# Patient Record
Sex: Female | Born: 1960 | Race: White | Hispanic: No | Marital: Married | Smoking: Former smoker
Health system: Southern US, Community
[De-identification: ages and names within clinical notes are randomized; demographics above are authoritative.]

## PROBLEM LIST (undated history)

## (undated) DIAGNOSIS — F32A Depression, unspecified: Secondary | ICD-10-CM

## (undated) DIAGNOSIS — G500 Trigeminal neuralgia: Secondary | ICD-10-CM

## (undated) DIAGNOSIS — E669 Obesity, unspecified: Secondary | ICD-10-CM

## (undated) DIAGNOSIS — Z780 Asymptomatic menopausal state: Secondary | ICD-10-CM

## (undated) DIAGNOSIS — F329 Major depressive disorder, single episode, unspecified: Secondary | ICD-10-CM

## (undated) DIAGNOSIS — I10 Essential (primary) hypertension: Secondary | ICD-10-CM

## (undated) DIAGNOSIS — M722 Plantar fascial fibromatosis: Secondary | ICD-10-CM

## (undated) DIAGNOSIS — E079 Disorder of thyroid, unspecified: Secondary | ICD-10-CM

## (undated) HISTORY — PX: CHOLECYSTECTOMY: SHX55

## (undated) HISTORY — DX: Trigeminal neuralgia: G50.0

## (undated) HISTORY — DX: Plantar fascial fibromatosis: M72.2

## (undated) HISTORY — DX: Asymptomatic menopausal state: Z78.0

## (undated) HISTORY — DX: Major depressive disorder, single episode, unspecified: F32.9

## (undated) HISTORY — DX: Depression, unspecified: F32.A

## (undated) HISTORY — PX: ABDOMINAL HYSTERECTOMY: SHX81

## (undated) HISTORY — DX: Obesity, unspecified: E66.9

## (undated) HISTORY — PX: FACIAL SIALOCELE MARSUPILIZATION: SHX1575

---

## 2005-02-11 ENCOUNTER — Ambulatory Visit: Payer: Self-pay | Admitting: Family Medicine

## 2006-02-17 ENCOUNTER — Ambulatory Visit: Payer: Self-pay | Admitting: Family Medicine

## 2007-06-01 DIAGNOSIS — J309 Allergic rhinitis, unspecified: Secondary | ICD-10-CM | POA: Insufficient documentation

## 2007-06-01 DIAGNOSIS — E039 Hypothyroidism, unspecified: Secondary | ICD-10-CM | POA: Insufficient documentation

## 2007-06-01 DIAGNOSIS — G500 Trigeminal neuralgia: Secondary | ICD-10-CM | POA: Insufficient documentation

## 2007-06-28 ENCOUNTER — Ambulatory Visit: Payer: Self-pay | Admitting: Family Medicine

## 2007-11-26 DIAGNOSIS — M722 Plantar fascial fibromatosis: Secondary | ICD-10-CM | POA: Insufficient documentation

## 2008-12-07 ENCOUNTER — Ambulatory Visit: Payer: Self-pay | Admitting: Unknown Physician Specialty

## 2009-02-08 ENCOUNTER — Ambulatory Visit: Payer: Self-pay | Admitting: Unknown Physician Specialty

## 2009-02-14 ENCOUNTER — Ambulatory Visit: Payer: Self-pay | Admitting: Unknown Physician Specialty

## 2010-08-02 ENCOUNTER — Ambulatory Visit: Payer: Self-pay | Admitting: Family Medicine

## 2011-04-28 ENCOUNTER — Ambulatory Visit: Payer: Self-pay | Admitting: Internal Medicine

## 2012-06-29 DIAGNOSIS — I10 Essential (primary) hypertension: Secondary | ICD-10-CM | POA: Insufficient documentation

## 2012-08-18 LAB — HM PAP SMEAR: HM Pap smear: NEGATIVE

## 2012-09-21 ENCOUNTER — Ambulatory Visit: Payer: Self-pay | Admitting: Family Medicine

## 2012-09-22 ENCOUNTER — Ambulatory Visit: Payer: Self-pay | Admitting: Family Medicine

## 2013-11-01 ENCOUNTER — Ambulatory Visit: Payer: Self-pay | Admitting: Family Medicine

## 2014-09-04 DIAGNOSIS — F32A Depression, unspecified: Secondary | ICD-10-CM | POA: Insufficient documentation

## 2014-09-04 DIAGNOSIS — F330 Major depressive disorder, recurrent, mild: Secondary | ICD-10-CM | POA: Insufficient documentation

## 2014-12-27 ENCOUNTER — Encounter: Payer: Self-pay | Admitting: Emergency Medicine

## 2014-12-27 ENCOUNTER — Ambulatory Visit
Admission: EM | Admit: 2014-12-27 | Discharge: 2014-12-27 | Disposition: A | Discharge status: Home / Self Care | Payer: PRIVATE HEALTH INSURANCE | Attending: Family Medicine | Admitting: Family Medicine

## 2014-12-27 DIAGNOSIS — J029 Acute pharyngitis, unspecified: Secondary | ICD-10-CM | POA: Diagnosis not present

## 2014-12-27 DIAGNOSIS — J0100 Acute maxillary sinusitis, unspecified: Secondary | ICD-10-CM

## 2014-12-27 HISTORY — DX: Disorder of thyroid, unspecified: E07.9

## 2014-12-27 LAB — RAPID STREP SCREEN (MED CTR MEBANE ONLY): Streptococcus, Group A Screen (Direct): NEGATIVE

## 2014-12-27 MED ORDER — AZITHROMYCIN 250 MG PO TABS: ORAL_TABLET | ORAL | Status: DC

## 2014-12-27 MED ORDER — BENZONATATE 100 MG PO CAPS: 100.0000 mg | ORAL_CAPSULE | Freq: Three times a day (TID) | ORAL | Status: DC | PRN

## 2014-12-27 NOTE — ED Provider Notes (Signed)
[facility] Department Provider Note  ____________________________________________  Time seen: Approximately 11:19 AM  I have reviewed the triage vital signs and the nursing notes.   HISTORY  Chief Complaint Nasal Congestion   HPI Christine Moody is a 54 y.o. female presents with complaints of 2-3 days history of runny nose, congestion and sore throat. Patient states started with sore throat, but reports some congestion prior to that.Also reports sinus pressure. States rare cough. States cold chills yesterday at home but denies known fever. Reports continues to eat and drink well. Denies chest pain, shortness of breath, abdominal pain, nausea, vomiting or diarrhea. States sore throat pain is scratchy and hurts to swallow at 6 out of 10. Denies other pain. Denies pain radiation.   Past Medical History  Diagnosis Date  . Thyroid disease     There are no active problems to display for this patient.   Past Surgical History  Procedure Laterality Date  . Abdominal hysterectomy    . Cholecystectomy      Current Outpatient Rx  Name  Route  Sig  Dispense  Refill  . aspirin EC 81 MG tablet   Oral   Take 81 mg by mouth daily.         . carbamazepine (TEGRETOL) 200 MG tablet   Oral   Take 200 mg by mouth 3 (three) times daily.         . citalopram (CELEXA) 20 MG tablet   Oral   Take 20 mg by mouth daily.         Marland Kitchen levothyroxine (SYNTHROID, LEVOTHROID) 125 MCG tablet   Oral   Take 125 mcg by mouth daily before breakfast.           Allergies Penicillins  History reviewed. No pertinent family history.  Social History Social History  Substance Use Topics  . Smoking status: Never Smoker   . Smokeless tobacco: None  . Alcohol Use: No    Review of Systems Constitutional: No fever/chills Eyes: No visual changes. ENT: Positive for runny nose, congestion, sore throat. Cardiovascular: Denies chest pain. Respiratory: Denies  shortness of breath. Gastrointestinal: No abdominal pain.  No nausea, no vomiting.  No diarrhea.  No constipation. Genitourinary: Negative for dysuria. Musculoskeletal: Negative for back pain. Skin: Negative for rash. Neurological: Negative for headaches, focal weakness or numbness.  10-point ROS otherwise negative.  ____________________________________________   PHYSICAL EXAM:  VITAL SIGNS: ED Triage Vitals  Enc Vitals Group     BP 12/27/14 1044 149/82 mmHg     Pulse Rate 12/27/14 1044 66     Resp 12/27/14 1044 18     Temp 12/27/14 1044 97.8 F (36.6 C)     Temp Source 12/27/14 1044 Tympanic     SpO2 12/27/14 1044 99 %     Weight 12/27/14 1044 316 lb (143.337 kg)     Height 12/27/14 1044 5\' 9"  (1.753 m)     Head Cir --      Peak Flow --      Pain Score 12/27/14 1048 6     Pain Loc --      Pain Edu? --      Excl. in [place]? --     Constitutional: Alert and oriented. Well appearing and in no acute distress. Eyes: Conjunctivae are normal. PERRL. EOMI. Head: Atraumatic.Mild TTP maxillary sinus, frontal sinus nonTTP.  Ears: no erythema, normal TMs bilaterally.   Nose: mild clear rhinorrhea.  Mouth/Throat: Mucous membranes are moist.  Pharynx  mod erythema, tonsils surgically absent. No exudate. Clearing secretions well.  Neck: No stridor.  No cervical spine tenderness to palpation. Hematological/Lymphatic/Immunilogical: mild anterior cervical lymphadenopathy. Cardiovascular: Normal rate, regular rhythm. Grossly normal heart sounds.  Good peripheral circulation. Respiratory: Normal respiratory effort.  No retractions. Lungs CTAB. Gastrointestinal: Soft and nontender. No distention. Normal Bowel sounds.  No abdominal bruits. No CVA tenderness. Musculoskeletal: No lower or upper extremity tenderness nor edema.  No joint effusions. Bilateral pedal pulses equal and easily palpated.  Neurologic:  Normal speech and language. No gross focal neurologic deficits are appreciated. No gait  instability. Skin:  Skin is warm, dry and intact. No rash noted. Psychiatric: Mood and affect are normal. Speech and behavior are normal.   ____________________________________________   LABS (all labs ordered are listed, but only abnormal results are displayed)  Labs Reviewed  RAPID STREP SCREEN (NOT AT [facility])  CULTURE, GROUP A STREP ([facility] ONLY)   _________________________________________   INITIAL IMPRESSION / ASSESSMENT AND PLAN / ED COURSE  Pertinent labs & imaging results that were available during my care of the patient were reviewed by me and considered in my medical decision making (see chart for details).  Well appearing patient. No acute distress. Presents for sore throat with some nasal congestion since monday. Denies chest pain, shortness of breath, abdominal pain. Will swab for strep throat. Strep swab negative, will culture. Reports sore throat x 2-3 days but states congestion and sinus pressure present prior to last 2-3 days. Will treat pharyngitis and sinusitis with azithromycin, and discussed supportive treatments including fluids and rest. Discussed follow up and return parameters. Patient verbalized understanding and agreed to plan.  ____________________________________________   FINAL CLINICAL IMPRESSION(S) / ED DIAGNOSES  Final diagnoses:  Pharyngitis  Acute maxillary sinusitis, recurrence not specified       Marylene Land, NP 12/27/14 1249

## 2014-12-27 NOTE — ED Notes (Signed)
Pt with a cough and nasal congestion x 3 days

## 2014-12-27 NOTE — Discharge Instructions (Signed)
Take medication as prescribed. Rest. Drink plenty of fluids.   Follow up with your primary care physician as needed. Return to Urgent care for new or worsening concerns.   Pharyngitis Pharyngitis is redness, pain, and swelling (inflammation) of your pharynx.  CAUSES  Pharyngitis is usually caused by infection. Most of the time, these infections are from viruses (viral) and are part of a cold. However, sometimes pharyngitis is caused by bacteria (bacterial). Pharyngitis can also be caused by allergies. Viral pharyngitis may be spread from person to person by coughing, sneezing, and personal items or utensils (cups, forks, spoons, toothbrushes). Bacterial pharyngitis may be spread from person to person by more intimate contact, such as kissing.  SIGNS AND SYMPTOMS  Symptoms of pharyngitis include:   Sore throat.   Tiredness (fatigue).   Low-grade fever.   Headache.  Joint pain and muscle aches.  Skin rashes.  Swollen lymph nodes.  Plaque-like film on throat or tonsils (often seen with bacterial pharyngitis). DIAGNOSIS  Your health care provider will ask you questions about your illness and your symptoms. Your medical history, along with a physical exam, is often all that is needed to diagnose pharyngitis. Sometimes, a rapid strep test is done. Other lab tests may also be done, depending on the suspected cause.  TREATMENT  Viral pharyngitis will usually get better in 3-4 days without the use of medicine. Bacterial pharyngitis is treated with medicines that kill germs (antibiotics).  HOME CARE INSTRUCTIONS   Drink enough water and fluids to keep your urine clear or pale yellow.   Only take over-the-counter or prescription medicines as directed by your health care provider:   If you are prescribed antibiotics, make sure you finish them even if you start to feel better.   Do not take aspirin.   Get lots of rest.   Gargle with 8 oz of salt water ( tsp of salt per 1 qt of  water) as often as every 1-2 hours to soothe your throat.   Throat lozenges (if you are not at risk for choking) or sprays may be used to soothe your throat. SEEK MEDICAL CARE IF:   You have large, tender lumps in your neck.  You have a rash.  You cough up green, yellow-brown, or bloody spit. SEEK IMMEDIATE MEDICAL CARE IF:   Your neck becomes stiff.  You drool or are unable to swallow liquids.  You vomit or are unable to keep medicines or liquids down.  You have severe pain that does not go away with the use of recommended medicines.  You have trouble breathing (not caused by a stuffy nose). MAKE SURE YOU:   Understand these instructions.  Will watch your condition.  Will get help right away if you are not doing well or get worse. Document Released: 04/07/2005 Document Revised: 01/26/2013 Document Reviewed: 12/13/2012 [facility] Patient Information 2015 [place], [place]. This information is not intended to replace advice given to you by your health care provider. Make sure you discuss any questions you have with your health care provider.  Sinusitis Sinusitis is redness, soreness, and inflammation of the paranasal sinuses. Paranasal sinuses are air pockets within the bones of your face (beneath the eyes, the middle of the forehead, or above the eyes). In healthy paranasal sinuses, mucus is able to drain out, and air is able to circulate through them by way of your nose. However, when your paranasal sinuses are inflamed, mucus and air can become trapped. This can allow bacteria and other germs to  grow and cause infection. Sinusitis can develop quickly and last only a short time (acute) or continue over a long period (chronic). Sinusitis that lasts for more than 12 weeks is considered chronic.  CAUSES  Causes of sinusitis include:  Allergies.  Structural abnormalities, such as displacement of the cartilage that separates your nostrils (deviated septum), which can decrease the air  flow through your nose and sinuses and affect sinus drainage.  Functional abnormalities, such as when the small hairs (cilia) that line your sinuses and help remove mucus do not work properly or are not present. SIGNS AND SYMPTOMS  Symptoms of acute and chronic sinusitis are the same. The primary symptoms are pain and pressure around the affected sinuses. Other symptoms include:  Upper toothache.  Earache.  Headache.  Bad breath.  Decreased sense of smell and taste.  A cough, which worsens when you are lying flat.  Fatigue.  Fever.  Thick drainage from your nose, which often is green and may contain pus (purulent).  Swelling and warmth over the affected sinuses. DIAGNOSIS  Your health care provider will perform a physical exam. During the exam, your health care provider may:  Look in your nose for signs of abnormal growths in your nostrils (nasal polyps).  Tap over the affected sinus to check for signs of infection.  View the inside of your sinuses (endoscopy) using an imaging device that has a light attached (endoscope). If your health care provider suspects that you have chronic sinusitis, one or more of the following tests may be recommended:  Allergy tests.  Nasal culture. A sample of mucus is taken from your nose, sent to a lab, and screened for bacteria.  Nasal cytology. A sample of mucus is taken from your nose and examined by your health care provider to determine if your sinusitis is related to an allergy. TREATMENT  Most cases of acute sinusitis are related to a viral infection and will resolve on their own within 10 days. Sometimes medicines are prescribed to help relieve symptoms (pain medicine, decongestants, nasal steroid sprays, or saline sprays).  However, for sinusitis related to a bacterial infection, your health care provider will prescribe antibiotic medicines. These are medicines that will help kill the bacteria causing the infection.  Rarely, sinusitis  is caused by a fungal infection. In theses cases, your health care provider will prescribe antifungal medicine. For some cases of chronic sinusitis, surgery is needed. Generally, these are cases in which sinusitis recurs more than 3 times per year, despite other treatments. HOME CARE INSTRUCTIONS   Drink plenty of water. Water helps thin the mucus so your sinuses can drain more easily.  Use a humidifier.  Inhale steam 3 to 4 times a day (for example, sit in the bathroom with the shower running).  Apply a warm, moist washcloth to your face 3 to 4 times a day, or as directed by your health care provider.  Use saline nasal sprays to help moisten and clean your sinuses.  Take medicines only as directed by your health care provider.  If you were prescribed either an antibiotic or antifungal medicine, finish it all even if you start to feel better. SEEK IMMEDIATE MEDICAL CARE IF:  You have increasing pain or severe headaches.  You have nausea, vomiting, or drowsiness.  You have swelling around your face.  You have vision problems.  You have a stiff neck.  You have difficulty breathing. MAKE SURE YOU:   Understand these instructions.  Will watch your condition.  Will get help right away if you are not doing well or get worse. Document Released: 04/07/2005 Document Revised: 08/22/2013 Document Reviewed: 04/22/2011 [facility] Patient Information 2015 [place], [place]. This information is not intended to replace advice given to you by your health care provider. Make sure you discuss any questions you have with your health care provider.

## 2014-12-29 LAB — CULTURE, GROUP A STREP (THRC)

## 2015-04-10 ENCOUNTER — Ambulatory Visit
Admission: EM | Admit: 2015-04-10 | Discharge: 2015-04-10 | Disposition: A | Discharge status: Home / Self Care | Payer: PRIVATE HEALTH INSURANCE | Attending: Family Medicine | Admitting: Family Medicine

## 2015-04-10 ENCOUNTER — Encounter: Payer: Self-pay | Admitting: Emergency Medicine

## 2015-04-10 DIAGNOSIS — N764 Abscess of vulva: Secondary | ICD-10-CM | POA: Diagnosis not present

## 2015-04-10 HISTORY — DX: Essential (primary) hypertension: I10

## 2015-04-10 MED ORDER — HYDROCODONE-ACETAMINOPHEN 5-325 MG PO TABS: 1.0000 | ORAL_TABLET | Freq: Three times a day (TID) | ORAL | Status: DC | PRN

## 2015-04-10 MED ORDER — MUPIROCIN 2 % EX OINT: 1.0000 "application " | TOPICAL_OINTMENT | Freq: Three times a day (TID) | CUTANEOUS | Status: DC

## 2015-04-10 MED ORDER — SULFAMETHOXAZOLE-TRIMETHOPRIM 800-160 MG PO TABS: 1.0000 | ORAL_TABLET | Freq: Two times a day (BID) | ORAL | Status: DC

## 2015-04-10 MED ORDER — KETOCONAZOLE 2 % EX CREA: 1.0000 "application " | TOPICAL_CREAM | Freq: Two times a day (BID) | CUTANEOUS | Status: DC

## 2015-04-10 MED ORDER — KETOCONAZOLE 2 % EX CREA: 1.0000 "application " | TOPICAL_CREAM | Freq: Every day | CUTANEOUS | Status: DC

## 2015-04-10 MED ORDER — CEFTRIAXONE SODIUM 1 G IJ SOLR
1.0000 g | Freq: Once | INTRAMUSCULAR | Status: AC
  Administered 2015-04-10: 1 g via INTRAMUSCULAR

## 2015-04-10 NOTE — ED Notes (Signed)
Patient c/o tender lump on her labia since the past weekend.

## 2015-04-10 NOTE — ED Provider Notes (Addendum)
CSN: [id number]     Arrival date & time 04/10/15  1341 History   First MD Initiated Contact with Patient 04/10/15 1515    Nurses notes were reviewed. Chief Complaint  Patient presents with  . Abscess   Patient is here because of her right labia abscess/swelling she states that on Friday she startedwith swelling in the right labia area. Never had labia swollen for the stasis is getting worse being bigger. Due to her husband's disability she has not been sexually active for over year. She denies any fever but states last night swelling in the right labia got worse and became more painful. She also reports increased moisture and irritation itching and irritation in that area as well. And was asking for what she can do to keep itching and irritation down in that area.     (Consider location/radiation/quality/duration/timing/severity/associated sxs/prior Treatment) Patient is a 54 y.o. female presenting with abscess. The history is provided by the patient. No language interpreter was used.  Abscess Location:  Ano-genital Ano-genital abscess location:  Vagina (Right labia) Abscess quality: fluctuance, induration, painful, redness and warmth   Red streaking: no   Pain details:    Quality:  Pressure, throbbing, shooting and sharp   Severity:  Severe Context: not diabetes, not immunosuppression, not injected drug use and not insect bite/sting   Relieved by:  Nothing Worsened by:  Nothing tried   Past Medical History  Diagnosis Date  . Thyroid disease   . Hypertension    Past Surgical History  Procedure Laterality Date  . Abdominal hysterectomy    . Cholecystectomy     History reviewed. No pertinent family history. Social History  Substance Use Topics  . Smoking status: Never Smoker   . Smokeless tobacco: None  . Alcohol Use: No   OB History    No data available     Review of Systems  Genitourinary: Positive for vaginal pain.  Skin: Positive for rash.    Allergies    Penicillins  Home Medications   Prior to Admission medications   Medication Sig Start Date End Date Taking? Authorizing Provider  lisinopril (PRINIVIL,ZESTRIL) 10 MG tablet Take 10 mg by mouth daily.   Yes Historical Provider, MD  aspirin EC 81 MG tablet Take 81 mg by mouth daily.    Historical Provider, MD  azithromycin (ZITHROMAX Z-PAK) 250 MG tablet Take 2 tablets (500 mg) on  Day 1,  followed by 1 tablet (250 mg) once daily on Days 2 through 5. 12/27/14   Marylene Land, NP  benzonatate (TESSALON PERLES) 100 MG capsule Take 1 capsule (100 mg total) by mouth 3 (three) times daily as needed for cough. 12/27/14   Marylene Land, NP  carbamazepine (TEGRETOL) 200 MG tablet Take 200 mg by mouth 3 (three) times daily.    Historical Provider, MD  citalopram (CELEXA) 20 MG tablet Take 20 mg by mouth daily.    Historical Provider, MD  ketoconazole (NIZORAL) 2 % cream Apply 1 application topically daily. 04/10/15   Frederich Cha, MD  levothyroxine (SYNTHROID, LEVOTHROID) 125 MCG tablet Take 125 mcg by mouth daily before breakfast.    Historical Provider, MD  mupirocin ointment (BACTROBAN) 2 % Apply 1 application topically 3 (three) times daily. 04/10/15   Frederich Cha, MD  sulfamethoxazole-trimethoprim (BACTRIM DS,SEPTRA DS) 800-160 MG tablet Take 1 tablet by mouth 2 (two) times daily. 04/10/15   Frederich Cha, MD   Meds Ordered and Administered this Visit   Medications  cefTRIAXone (ROCEPHIN) injection 1 g (  not administered)    BP 132/80 mmHg  Pulse 75  Temp(Src) 97.7 F (36.5 C) (Tympanic)  Resp 17  Ht 5\' 8"  (1.727 m)  Wt 300 lb (136.079 kg)  BMI 45.63 kg/m2  SpO2 99% No data found.   Physical Exam  Constitutional: She is oriented to person, place, and time. She appears well-developed and well-nourished.  HENT:  Head: Normocephalic.  Neck: Neck supple.  Genitourinary:     Patient had marked swelling of the right labia about a third of the way down to about almost rest of the outer  labia lip.  Neurological: She is alert and oriented to person, place, and time. No cranial nerve deficit.  Skin: Skin is warm.  Psychiatric: She has a normal mood and affect.    ED Course  .Marland KitchenIncision and Drainage Date/Time: 04/10/2015 4:58 PM Performed by: Frederich Cha Authorized by: Frederich Cha Consent: Verbal consent obtained. Risks and benefits: risks, benefits and alternatives were discussed Consent given by: patient Type: abscess Body area: anogenital Location details: vulva Anesthesia method: Labia was injected with 2% lidocaine. Local anesthetic: lidocaine 2% without epinephrine Patient sedated: no Scalpel size: 11 Incision type: single straight Complexity: complex Drainage: bloody Drainage amount: copious Wound treatment: wound left open Packing material: 1/4 in gauze Comments: Initially an incision was made inferior aspect of the abscess unsuccessful though. Then another incision was made superior to that which did get a lot of bloody thick material out. The amount of thickness of the material reminds more of a sebaceous cyst that got infected versus a true abscess started on his own. Patient reports a lot of discomfort during the procedure but then improvement at the procedure was done. Initially plan to put a suture in the lower incision but after the packing and the swelling of the initial labia abscess went down the wound closed on its own.   (including critical care time)  Labs Review Labs Reviewed  CULTURE, ROUTINE-ABSCESS    Imaging Review No results found.   Visual Acuity Review  Right Eye Distance:   Left Eye Distance:   Bilateral Distance:    Right Eye Near:   Left Eye Near:    Bilateral Near:         MDM   1. Labial abscess     We'll place on Septra DS 1 tablet twice a day. Vicodin for pain work note for today and tomorrow and Thursday and return Thursday for removal of packing and possible repacking. Will also add Bactroban ointment to  use 3 times a day once the packing has been removed. Also discussed by hygiene in the area and using Nizoral cream twice a day for about 2 weeks keeping the area extremely dry by changing unawares and wearing cotton underwear's and sleeping without pain on it night. She states she uses pads and what medication past more frequently as well she is having trouble with moisture being kept the area. Should be noted culture was also obtained on the wound.  Due to the nature and size of abscess she is also given a gram of Rocephin IM as well. Patient tolerated the injection well.    Frederich Cha, MD 04/10/15 1703  Frederich Cha, MD 04/17/15 [phone]

## 2015-04-10 NOTE — Discharge Instructions (Signed)
Abscess °An abscess (boil or furuncle) is an infected area on or under the skin. This area is filled with yellowish-white fluid (pus) and other material (debris). °HOME CARE  °· Only take medicines as told by your doctor. °· If you were given antibiotic medicine, take it as directed. Finish the medicine even if you start to feel better. °· If gauze is used, follow your doctor's directions for changing the gauze. °· To avoid spreading the infection: °¨ Keep your abscess covered with a bandage. °¨ Wash your hands well. °¨ Do not share personal care items, towels, or whirlpools with others. °¨ Avoid skin contact with others. °· Keep your skin and clothes clean around the abscess. °· Keep all doctor visits as told. °GET HELP RIGHT AWAY IF:  °· You have more pain, puffiness (swelling), or redness in the wound site. °· You have more fluid or blood coming from the wound site. °· You have muscle aches, chills, or you feel sick. °· You have a fever. °MAKE SURE YOU:  °· Understand these instructions. °· Will watch your condition. °· Will get help right away if you are not doing well or get worse. °  °This information is not intended to replace advice given to you by your health care provider. Make sure you discuss any questions you have with your health care provider. °  °Document Released: 09/24/2007 Document Revised: 10/07/2011 Document Reviewed: 06/21/2011 °[employer] Interactive Patient Education ©2016 [employer] ° °

## 2015-04-12 ENCOUNTER — Encounter: Payer: Self-pay | Admitting: Emergency Medicine

## 2015-04-12 ENCOUNTER — Ambulatory Visit
Admission: EM | Admit: 2015-04-12 | Discharge: 2015-04-12 | Disposition: A | Discharge status: Home / Self Care | Payer: PRIVATE HEALTH INSURANCE | Attending: Family Medicine | Admitting: Family Medicine

## 2015-04-12 DIAGNOSIS — Z4801 Encounter for change or removal of surgical wound dressing: Secondary | ICD-10-CM

## 2015-04-12 DIAGNOSIS — Z09 Encounter for follow-up examination after completed treatment for conditions other than malignant neoplasm: Secondary | ICD-10-CM

## 2015-04-12 NOTE — ED Provider Notes (Signed)
CSN: [id number]     Arrival date & time 04/12/15  1018 History   First MD Initiated Contact with Patient 04/12/15 1045    Nurses notes were reviewed. Chief Complaint  Patient presents with  . Wound Check    Patient's here for wound check with reevaluation.   (Consider location/radiation/quality/duration/timing/severity/associated sxs/prior Treatment) Patient is a 54 y.o. female presenting with wound check. The history is provided by the patient. No language interpreter was used.  Wound Check This is a new problem. The current episode started 2 days ago. The problem has been rapidly improving. Pertinent negatives include no chest pain, no abdominal pain, no headaches and no shortness of breath. Nothing aggravates the symptoms. Nothing relieves the symptoms. The treatment provided no relief.    Past Medical History  Diagnosis Date  . Thyroid disease   . Hypertension    Past Surgical History  Procedure Laterality Date  . Abdominal hysterectomy    . Cholecystectomy     No family history on file. Social History  Substance Use Topics  . Smoking status: Never Smoker   . Smokeless tobacco: None  . Alcohol Use: No   OB History    No data available     Review of Systems  Respiratory: Negative for shortness of breath.   Cardiovascular: Negative for chest pain.  Gastrointestinal: Negative for abdominal pain.  Neurological: Negative for headaches.  All other systems reviewed and are negative.   Allergies  Penicillins  Home Medications   Prior to Admission medications   Medication Sig Start Date End Date Taking? Authorizing Provider  aspirin EC 81 MG tablet Take 81 mg by mouth daily.    Historical Provider, MD  azithromycin (ZITHROMAX Z-PAK) 250 MG tablet Take 2 tablets (500 mg) on  Day 1,  followed by 1 tablet (250 mg) once daily on Days 2 through 5. 12/27/14   Marylene Land, NP  benzonatate (TESSALON PERLES) 100 MG capsule Take 1 capsule (100 mg total) by mouth 3 (three)  times daily as needed for cough. 12/27/14   Marylene Land, NP  carbamazepine (TEGRETOL) 200 MG tablet Take 200 mg by mouth 3 (three) times daily.    Historical Provider, MD  citalopram (CELEXA) 20 MG tablet Take 20 mg by mouth daily.    Historical Provider, MD  HYDROcodone-acetaminophen (NORCO) 5-325 MG tablet Take 1 tablet by mouth every 8 (eight) hours as needed for moderate pain. 04/10/15   Frederich Cha, MD  ketoconazole (NIZORAL) 2 % cream Apply 1 application topically 2 (two) times daily. 04/10/15   Frederich Cha, MD  levothyroxine (SYNTHROID, LEVOTHROID) 125 MCG tablet Take 125 mcg by mouth daily before breakfast.    Historical Provider, MD  lisinopril (PRINIVIL,ZESTRIL) 10 MG tablet Take 10 mg by mouth daily.    Historical Provider, MD  mupirocin ointment (BACTROBAN) 2 % Apply 1 application topically 3 (three) times daily. 04/10/15   Frederich Cha, MD  sulfamethoxazole-trimethoprim (BACTRIM DS,SEPTRA DS) 800-160 MG tablet Take 1 tablet by mouth 2 (two) times daily. 04/10/15   Frederich Cha, MD   Meds Ordered and Administered this Visit  Medications - No data to display  BP 135/80 mmHg  Pulse 75  Temp(Src) 98 F (36.7 C) (Tympanic)  Resp 16  Ht 5\' 8"  (1.727 m)  Wt 300 lb (136.079 kg)  BMI 45.63 kg/m2  SpO2 98% No data found.   Physical Exam  Constitutional: She is oriented to person, place, and time. She appears well-developed and well-nourished.  HENT:  Head:  Normocephalic.  Neck: Neck supple.  Neurological: She is alert and oriented to person, place, and time.  Skin: Skin is warm and dry. No erythema.  Psychiatric: She has a normal mood and affect.  Vitals reviewed.   ED Course  Wound packing Date/Time: 04/12/2015 11:50 AM Performed by: Frederich Cha Authorized by: Frederich Cha Consent: Verbal consent obtained. Preparation: Patient was prepped and draped in the usual sterile fashion. Local anesthesia used: no Patient sedated: no Patient tolerance: Patient tolerated the  procedure well with no immediate complications Comments: All packing was removed and new packing placed in the right labia. The lower incision on the right labium appears to be doing well. More purulent material was expressed with manipulation from the opening. Following that new packing is placed in the wound to continue to allow for drainage. Patient tolerated well and have her return on Saturday for recheck.   (including critical care time)  Labs Review Labs Reviewed - No data to display  Imaging Review No results found.   Visual Acuity Review  Right Eye Distance:   Left Eye Distance:   Bilateral Distance:    Right Eye Near:   Left Eye Near:    Bilateral Near:         MDM   1. Encounter for recheck of abscess following incision and drainage    Patient instructed to return in 48 hours for evaluation and possible repacking. If the wound is repacked in 48 hours will recheck her in 72 hours.    Frederich Cha, MD 04/12/15 [phone]

## 2015-04-12 NOTE — ED Notes (Signed)
Pt reports seen here tues for labial abscess and had I&D procedure and packing is place and told to return today for removal of packing. Pt denies worsening signs of infection or fever

## 2015-04-13 ENCOUNTER — Telehealth: Payer: Self-pay | Admitting: Family Medicine

## 2015-04-13 NOTE — Telephone Encounter (Signed)
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 Rx needs to be changed

## 2015-04-13 NOTE — ED Notes (Signed)
Culture shows sensitivity to TMP/SMX. Patient is on Septra according to the notes and is improving. No need to change antibiotics at this time.   Paulina Fusi, MD 04/13/15 [phone]

## 2015-04-14 ENCOUNTER — Encounter: Payer: Self-pay | Admitting: *Deleted

## 2015-04-14 ENCOUNTER — Ambulatory Visit
Admission: EM | Admit: 2015-04-14 | Discharge: 2015-04-14 | Disposition: A | Discharge status: Home / Self Care | Payer: PRIVATE HEALTH INSURANCE | Attending: Family Medicine | Admitting: Family Medicine

## 2015-04-14 DIAGNOSIS — N764 Abscess of vulva: Secondary | ICD-10-CM

## 2015-04-14 LAB — CULTURE, ROUTINE-ABSCESS: SPECIAL REQUESTS: NORMAL

## 2015-04-14 NOTE — ED Provider Notes (Signed)
CSN: [id number]     Arrival date & time 04/14/15  [id number] History   First MD Initiated Contact with Patient 04/14/15 1213     Chief Complaint  Patient presents with  . Wound Check   (Consider location/radiation/quality/duration/timing/severity/associated sxs/prior Treatment) HPI   Christine Moody returns today for wound check of a right labial abscess. She states that the packing had come out partially. He complains that when she urinates she has a lot of pain. He does think that it is somewhat better from the pain perspective still remains swollen. She is afebrile. She is continue to take her medication as prescribed    Past Medical History  Diagnosis Date  . Thyroid disease   . Hypertension    Past Surgical History  Procedure Laterality Date  . Abdominal hysterectomy    . Cholecystectomy     History reviewed. No pertinent family history. Social History  Substance Use Topics  . Smoking status: Never Smoker   . Smokeless tobacco: Never Used  . Alcohol Use: No   OB History    No data available     Review of Systems  Genitourinary: Positive for dysuria and genital sores. Negative for vaginal bleeding, vaginal discharge and vaginal pain.  Skin: Positive for wound.  All other systems reviewed and are negative.   Allergies  Penicillins  Home Medications   Prior to Admission medications   Medication Sig Start Date End Date Taking? Authorizing Provider  aspirin EC 81 MG tablet Take 81 mg by mouth daily.   Yes Historical Provider, MD  azithromycin (ZITHROMAX Z-PAK) 250 MG tablet Take 2 tablets (500 mg) on  Day 1,  followed by 1 tablet (250 mg) once daily on Days 2 through 5. 12/27/14  Yes Marylene Land, NP  benzonatate (TESSALON PERLES) 100 MG capsule Take 1 capsule (100 mg total) by mouth 3 (three) times daily as needed for cough. 12/27/14  Yes Marylene Land, NP  carbamazepine (TEGRETOL) 200 MG tablet Take 200 mg by mouth 3 (three) times daily.   Yes Historical Provider, MD   citalopram (CELEXA) 20 MG tablet Take 20 mg by mouth daily.   Yes Historical Provider, MD  HYDROcodone-acetaminophen (NORCO) 5-325 MG tablet Take 1 tablet by mouth every 8 (eight) hours as needed for moderate pain. 04/10/15  Yes Frederich Cha, MD  ketoconazole (NIZORAL) 2 % cream Apply 1 application topically 2 (two) times daily. 04/10/15  Yes Frederich Cha, MD  levothyroxine (SYNTHROID, LEVOTHROID) 125 MCG tablet Take 125 mcg by mouth daily before breakfast.   Yes Historical Provider, MD  lisinopril (PRINIVIL,ZESTRIL) 10 MG tablet Take 10 mg by mouth daily.   Yes Historical Provider, MD  mupirocin ointment (BACTROBAN) 2 % Apply 1 application topically 3 (three) times daily. 04/10/15  Yes Frederich Cha, MD  sulfamethoxazole-trimethoprim (BACTRIM DS,SEPTRA DS) 800-160 MG tablet Take 1 tablet by mouth 2 (two) times daily. 04/10/15  Yes Frederich Cha, MD   Meds Ordered and Administered this Visit  Medications - No data to display  BP 143/71 mmHg  Pulse 73  Temp(Src) 98.3 F (36.8 C) (Oral)  Resp 20  SpO2 95% No data found.   Physical Exam  Constitutional: She is oriented to person, place, and time. She appears well-developed and well-nourished. No distress.  HENT:  Head: Normocephalic and atraumatic.  Eyes: Conjunctivae are normal. Pupils are equal, round, and reactive to light.  Neck: Normal range of motion. Neck supple.  Genitourinary:  Bonnita Nasuti, CMA, was my Environmental consultant and chaperone. Examination of the  right labia shows induration and welling with tenderness superior and anterior to the incision of the lower labia. Manually  was able to express additional thick white bases type material from the incision. No packing was present. The upper portion of the labia is a large amount of induration but no fluctuance. The area was widely prepped with Hibiclens small amount of new packing was placed into the depth of the wound with a half inch protruding from the wound opening.Although somewhat painful the  patient tolerated the procedure well.  Musculoskeletal: Normal range of motion.  Neurological: She is alert and oriented to person, place, and time.  Skin: Skin is warm and dry. She is not diaphoretic.  Psychiatric: She has a normal mood and affect. Her behavior is normal. Judgment and thought content normal.  Nursing note and vitals reviewed.   ED Course  Procedures (including critical care time)  Labs Review Labs Reviewed - No data to display  Imaging Review No results found.   Visual Acuity Review  Right Eye Distance:   Left Eye Distance:   Bilateral Distance:    Right Eye Near:   Left Eye Near:    Bilateral Near:         MDM   1. Labial abscess    Plan: 1. Diagnosis reviewed with patient 2. rx as per orders; risks, benefits, potential side effects reviewed with patient 3. Recommend supportive treatment with continued use of antibiotics. She will return to our clinic in 2 days for removal of packing and reevaluation. I told her that because of the amount of induration that she has superior to her incision that seems to be larger and much more tender fluctuance that she may require referral to GYN for further eye drainage and care. Material expressed from the wound itself was not purulent but had more of a consistency of sebaceous material. Arrange referral with Dr. Marcelline Mates Monday if she is not markedly improved. In the meantime I have asked her to careful of the packing and otherwise no changes in the present plan. C&S of the initial I&D showed that there was sensitivity to her present Septra and this will be continued 4. F/u prn if symptoms worsen or don't improve     Lorin Picket, PA-C 04/14/15 1422

## 2015-04-14 NOTE — Discharge Instructions (Signed)
Abscess °An abscess is an infected area that contains a collection of pus and debris. It can occur in almost any part of the body. An abscess is also known as a furuncle or boil. °CAUSES  °An abscess occurs when tissue gets infected. This can occur from blockage of oil or sweat glands, infection of hair follicles, or a minor injury to the skin. As the body tries to fight the infection, pus collects in the area and creates pressure under the skin. This pressure causes pain. People with weakened immune systems have difficulty fighting infections and get certain abscesses more often.  °SYMPTOMS °Usually an abscess develops on the skin and becomes a painful mass that is red, warm, and tender. If the abscess forms under the skin, you may feel a moveable soft area under the skin. Some abscesses break open (rupture) on their own, but most will continue to get worse without care. The infection can spread deeper into the body and eventually into the bloodstream, causing you to feel ill.  °DIAGNOSIS  °Your caregiver will take your medical history and perform a physical exam. A sample of fluid may also be taken from the abscess to determine what is causing your infection. °TREATMENT  °Your caregiver may prescribe antibiotic medicines to fight the infection. However, taking antibiotics alone usually does not cure an abscess. Your caregiver may need to make a small cut (incision) in the abscess to drain the pus. In some cases, gauze is packed into the abscess to reduce pain and to continue draining the area. °HOME CARE INSTRUCTIONS  °· Only take over-the-counter or prescription medicines for pain, discomfort, or fever as directed by your caregiver. °· If you were prescribed antibiotics, take them as directed. Finish them even if you start to feel better. °· If gauze is used, follow your caregiver's directions for changing the gauze. °· To avoid spreading the infection: °· Keep your draining abscess covered with a  bandage. °· Wash your hands well. °· Do not share personal care items, towels, or whirlpools with others. °· Avoid skin contact with others. °· Keep your skin and clothes clean around the abscess. °· Keep all follow-up appointments as directed by your caregiver. °SEEK MEDICAL CARE IF:  °· You have increased pain, swelling, redness, fluid drainage, or bleeding. °· You have muscle aches, chills, or a general ill feeling. °· You have a fever. °MAKE SURE YOU:  °· Understand these instructions. °· Will watch your condition. °· Will get help right away if you are not doing well or get worse. °  °This information is not intended to replace advice given to you by your health care provider. Make sure you discuss any questions you have with your health care provider. °  °Document Released: 01/15/2005 Document Revised: 10/07/2011 Document Reviewed: 06/20/2011 °Elsevier Interactive Patient Education ©2016 [employer] ° °Incision and Drainage °Incision and drainage is a procedure in which a sac-like structure (cystic structure) is opened and drained. The area to be drained usually contains material such as pus, fluid, or blood.  °LET YOUR CAREGIVER KNOW ABOUT:  °· Allergies to medicine. °· Medicines taken, including vitamins, herbs, eyedrops, over-the-counter medicines, and creams. °· Use of steroids (by mouth or creams). °· Previous problems with anesthetics or numbing medicines. °· History of bleeding problems or blood clots. °· Previous surgery. °· Other health problems, including diabetes and kidney problems. °· Possibility of pregnancy, if this applies. °RISKS AND COMPLICATIONS °· Pain. °· Bleeding. °· Scarring. °· Infection. °BEFORE THE PROCEDURE  °  You may need to have an ultrasound or other imaging tests to see how large or deep your cystic structure is. Blood tests may also be used to determine if you have an infection or how severe the infection is. You may need to have a tetanus shot. °PROCEDURE  °The affected area  is cleaned with a cleaning fluid. The cyst area will then be numbed with a medicine (local anesthetic). A small incision will be made in the cystic structure. A syringe or catheter may be used to drain the contents of the cystic structure, or the contents may be squeezed out. The area will then be flushed with a cleansing solution. After cleansing the area, it is often gently packed with a gauze or another wound dressing. Once it is packed, it will be covered with gauze and tape or some other type of wound dressing.  °AFTER THE PROCEDURE  °· Often, you will be allowed to go home right after the procedure. °· You may be given antibiotic medicine to prevent or heal an infection. °· If the area was packed with gauze or some other wound dressing, you will likely need to come back in 1 to 2 days to get it removed. °· The area should heal in about 14 days. °  °This information is not intended to replace advice given to you by your health care provider. Make sure you discuss any questions you have with your health care provider. °  °Document Released: 10/01/2000 Document Revised: 10/07/2011 Document Reviewed: 06/02/2011 °Elsevier Interactive Patient Education ©2016 [employer] Inc[employer] ° °

## 2015-04-14 NOTE — ED Notes (Signed)
Patient is here for a wound check

## 2015-04-16 ENCOUNTER — Ambulatory Visit
Admission: EM | Admit: 2015-04-16 | Discharge: 2015-04-16 | Disposition: A | Discharge status: Home / Self Care | Payer: PRIVATE HEALTH INSURANCE | Attending: Family Medicine | Admitting: Family Medicine

## 2015-04-16 DIAGNOSIS — Z4801 Encounter for change or removal of surgical wound dressing: Secondary | ICD-10-CM

## 2015-04-16 DIAGNOSIS — N764 Abscess of vulva: Secondary | ICD-10-CM

## 2015-04-16 NOTE — ED Notes (Signed)
Started 1 week ago with abscess right labia. Has had I&D done and packing every 2nd day. Here for wound recheck

## 2015-04-16 NOTE — ED Provider Notes (Signed)
CSN: [id number]     Arrival date & time 04/16/15  1013 History   First MD Initiated Contact with Patient 04/16/15 1120     Chief Complaint  Patient presents with  . Abscess   (Consider location/radiation/quality/duration/timing/severity/associated sxs/prior Treatment) HPI Comments: 54 yo female with a h/o right outer labia abscess (bartholin vs sebaceous cyst) presents for follow up, s/p I&D, packing removal. States doing better; denies pain; states less redness and swelling; not sure if packing came out. Wound was repacked 2 days ago. Still taking the oral antibiotic (Bactrim). Denies fevers, chills.   Patient is a 54 y.o. female presenting with abscess. The history is provided by the patient.  Abscess   Past Medical History  Diagnosis Date  . Thyroid disease   . Hypertension    Past Surgical History  Procedure Laterality Date  . Abdominal hysterectomy    . Cholecystectomy     Family History  Problem Relation Age of Onset  . Cancer Mother   . Coronary artery disease Father   . Cancer Father    Social History  Substance Use Topics  . Smoking status: Former Research scientist (life sciences)  . Smokeless tobacco: Never Used  . Alcohol Use: No   OB History    No data available     Review of Systems  Allergies  Penicillins  Home Medications   Prior to Admission medications   Medication Sig Start Date End Date Taking? Authorizing Provider  aspirin EC 81 MG tablet Take 81 mg by mouth daily.   Yes Historical Provider, MD  carbamazepine (TEGRETOL) 200 MG tablet Take 200 mg by mouth 3 (three) times daily.   Yes Historical Provider, MD  citalopram (CELEXA) 20 MG tablet Take 20 mg by mouth daily.   Yes Historical Provider, MD  HYDROcodone-acetaminophen (NORCO) 5-325 MG tablet Take 1 tablet by mouth every 8 (eight) hours as needed for moderate pain. 04/10/15  Yes Frederich Cha, MD  levothyroxine (SYNTHROID, LEVOTHROID) 125 MCG tablet Take 125 mcg by mouth daily before breakfast.   Yes Historical  Provider, MD  lisinopril (PRINIVIL,ZESTRIL) 10 MG tablet Take 10 mg by mouth daily.   Yes Historical Provider, MD  sulfamethoxazole-trimethoprim (BACTRIM DS,SEPTRA DS) 800-160 MG tablet Take 1 tablet by mouth 2 (two) times daily. 04/10/15  Yes Frederich Cha, MD  azithromycin (ZITHROMAX Z-PAK) 250 MG tablet Take 2 tablets (500 mg) on  Day 1,  followed by 1 tablet (250 mg) once daily on Days 2 through 5. 12/27/14   Marylene Land, NP  benzonatate (TESSALON PERLES) 100 MG capsule Take 1 capsule (100 mg total) by mouth 3 (three) times daily as needed for cough. 12/27/14   Marylene Land, NP  ketoconazole (NIZORAL) 2 % cream Apply 1 application topically 2 (two) times daily. 04/10/15   Frederich Cha, MD  mupirocin ointment (BACTROBAN) 2 % Apply 1 application topically 3 (three) times daily. 04/10/15   Frederich Cha, MD   Meds Ordered and Administered this Visit  Medications - No data to display  BP 152/112 mmHg  Pulse 80  Temp(Src) 97.8 F (36.6 C) (Tympanic)  Resp 18  Ht 5\' 8"  (1.727 m)  Wt 300 lb (136.079 kg)  BMI 45.63 kg/m2  SpO2 97% No data found.   Physical Exam  Constitutional: She appears well-developed and well-nourished. No distress.  Genitourinary:     Minimal skin erythema over right outer labia; no tenderness; healed skin incisions; no packing noted in area  Skin: She is not diaphoretic.  Nursing note and vitals  reviewed.   ED Course  Procedures (including critical care time)  Labs Review Labs Reviewed - No data to display  Imaging Review No results found.   Visual Acuity Review  Right Eye Distance:   Left Eye Distance:   Bilateral Distance:    Right Eye Near:   Left Eye Near:    Bilateral Near:         MDM   1. Labial abscess   (improved; resolving)  1. diagnosis reviewed with patient; recommend patient finish current antibiotic course, warm compresses/sitz baths; follow up with gynecologist.  Norval Gable, MD 04/16/15 1147

## 2015-05-10 ENCOUNTER — Encounter: Payer: Self-pay | Admitting: Obstetrics and Gynecology

## 2015-05-10 ENCOUNTER — Ambulatory Visit (INDEPENDENT_AMBULATORY_CARE_PROVIDER_SITE_OTHER): Payer: PRIVATE HEALTH INSURANCE | Admitting: Obstetrics and Gynecology

## 2015-05-10 VITALS — BP 132/78 | HR 63 | Wt 315.9 lb

## 2015-05-10 DIAGNOSIS — N393 Stress incontinence (female) (male): Secondary | ICD-10-CM | POA: Diagnosis not present

## 2015-05-10 DIAGNOSIS — N750 Cyst of Bartholin's gland: Secondary | ICD-10-CM

## 2015-05-13 ENCOUNTER — Encounter: Payer: Self-pay | Admitting: Obstetrics and Gynecology

## 2015-05-13 NOTE — Progress Notes (Signed)
GYNECOLOGY PROGRESS NOTE  Subjective:    Patient ID: Christine Moody, female    DOB: 1960-08-15, 55 y.o.   MRN: [id number]  HPI  Patient is a 55 y.o. P89 female who presents as a referral from Urgent Care for possible surgical consult for labial abscess.  Patient notes that she developed a right labial abscess approximately 3 weeks ago.  Was seen in the Urgent Care where an I&D was performed.  Was told that she should see a GYN about possibly having the cyst wall excised.     Past Medical History  Diagnosis Date  . Thyroid disease   . Hypertension   . Depression     Family History  Problem Relation Age of Onset  . Breast cancer Mother   . Coronary artery disease Father   . Cancer Father     prostate    Past Surgical History  Procedure Laterality Date  . Abdominal hysterectomy    . Cholecystectomy      Social History   Social History  . Marital Status: Married    Spouse Name: N/A  . Number of Children: N/A  . Years of Education: N/A   Occupational History  . Not on file.   Social History Main Topics  . Smoking status: Former Smoker    Quit date: 04/21/1990  . Smokeless tobacco: Never Used  . Alcohol Use: No  . Drug Use: No  . Sexual Activity: Yes    Birth Control/ Protection: Surgical   Other Topics Concern  . Not on file   Social History Narrative    Current Outpatient Prescriptions on File Prior to Visit  Medication Sig Dispense Refill  . aspirin EC 81 MG tablet Take 81 mg by mouth daily.    . carbamazepine (TEGRETOL) 200 MG tablet Take 200 mg by mouth 3 (three) times daily.    Marland Kitchen levothyroxine (SYNTHROID, LEVOTHROID) 125 MCG tablet Take 125 mcg by mouth daily before breakfast.    . lisinopril (PRINIVIL,ZESTRIL) 10 MG tablet Take 10 mg by mouth daily.     No current facility-administered medications on file prior to visit.    Allergies  Allergen Reactions  . Aspirin Hives  . Penicillins Rash and Hives     Review of Systems A  comprehensive review of systems was negative except for: Genitourinary: positive for urinary incontinence and right labial itching at site of abscess.  Leakage of urine mostly noted with coughing, sneezing, changing positions.   Objective:   Blood pressure 132/78, pulse 63, weight 315 lb 14.4 oz (143.291 kg). General appearance: alert and no distress, obese Abdomen: soft, non-tender; bowel sounds normal; no masses,  no organomegaly Pelvic: external genitalia normal, no adnexal masses or tenderness, no cervical motion tenderness, rectovaginal septum normal, urethra without abnormality or discharge, uterus surgically absent and vagina normal without discharge.  Cystocele (Grade 2), present, hypermobility of urethra on Q-tip test. No leakage of urine on Valsalva.  Extremities: extremities normal, atraumatic, no cyanosis or edema Neurologic: Grossly normal   Assessment:   H/o labial abscess Stress urinary incontinence  Plan:  - Discussed with patient that usually excision/marsupialization occurs in the presence of the cyst/abscess so that the cyst wall can be identified. Currently labia well healed.  Can consider removal if cyst returns.  - Discussed stress incontinence and management.  Patient inquires about Poise Impressa vaginal inserts.  Advised patient that she could attempt a trial.  Also discussed weight management, Kegel exercises, avoidance of bladder irritants.  If no improvement in symptoms, can consider use of pessary, or possible surgical intervention with sling.   - To f/u in 3 weeks.    Rubie Maid, MD [facility]

## 2015-05-31 ENCOUNTER — Ambulatory Visit: Payer: PRIVATE HEALTH INSURANCE | Admitting: Obstetrics and Gynecology

## 2015-08-08 ENCOUNTER — Ambulatory Visit (INDEPENDENT_AMBULATORY_CARE_PROVIDER_SITE_OTHER): Payer: PRIVATE HEALTH INSURANCE | Admitting: Obstetrics and Gynecology

## 2015-08-08 ENCOUNTER — Encounter: Payer: Self-pay | Admitting: Obstetrics and Gynecology

## 2015-08-08 VITALS — BP 112/71 | HR 71 | Ht 68.0 in | Wt 312.6 lb

## 2015-08-08 DIAGNOSIS — R238 Other skin changes: Secondary | ICD-10-CM

## 2015-08-08 DIAGNOSIS — L989 Disorder of the skin and subcutaneous tissue, unspecified: Secondary | ICD-10-CM | POA: Diagnosis not present

## 2015-08-08 DIAGNOSIS — R6882 Decreased libido: Secondary | ICD-10-CM | POA: Diagnosis not present

## 2015-08-08 DIAGNOSIS — N393 Stress incontinence (female) (male): Secondary | ICD-10-CM

## 2015-08-08 MED ORDER — ESTRADIOL 1 MG PO TABS: 1.0000 mg | ORAL_TABLET | Freq: Every day | ORAL | Status: DC

## 2015-08-08 NOTE — Progress Notes (Signed)
    GYNECOLOGY PROGRESS NOTE  Subjective:    Patient ID: Christine Moody, female    DOB: 1961-02-23, 55 y.o.   MRN: [id number]  HPI  Patient is a 55 y.o. female who presents for follow up of stress incontinence.  Has not been seen since January for complaints of urinary leakage due to job schedule.   Patient notes that she has tried the [employer] which are working very well for her.  Desires to continue use.   Patient also complains of decreased sex drive today.  Notes that this has been ongoing since after her last visit (so approximately 3 months).  Reports orgasms are not as strong, and desire is not as frequent.  Also notes that husband has ED, and has recently begun seeking treatment for this (however insurance may not cover medications prescribed for him).   In addition, patient complains of irritation and excessive sweating under skin folds (abdomen and groin). Is wondering if there is anything she can do to help this.   The following portions of the patient's history were reviewed and updated as appropriate: allergies, current medications, past family history, past medical history, past social history, past surgical history and problem list.  Review of Systems Pertinent items noted in HPI and remainder of comprehensive ROS otherwise negative.   Objective:   Blood pressure 112/71, pulse 71, height 5\' 8"  (1.727 m), weight 312 lb 9.6 oz (141.794 kg). General appearance: alert and no distress Abdomen: soft, non-tender; bowel sounds normal; no masses,  no organomegaly Skin: hyperemic area noted under pannus, no active infection present.    Assessment:   Stress incontinence Decreased libido Skin irritation  Plan:   1. Patient notes vast improvement with use of [place] Impressa pads, desires to continue using at this time.  May consider pessary at a later date.  2. Decreased libido for past 3 months.  Notes that she has been under a lot of stress with job.  Reports reading  romance novels which help.  Has also tried several natural remedies with no improvement. Also dealing with husband's ED.  Discussed use of hormones, including estrogen/testosterone.  Patient desires to try estrogen.  Will prescribe 1 mg Estrace.  To notify MD in 3-4 weeks as to outcome of treatment.  3. Skin irritation due to excessive sweating in skin folds.  Discussed that due to obesity, skin irritation and even yeast infections were common in skin folds. Recommended powder/cornstarch to use in folds to decrease moisture.  Can use OTC hydrocortisone or diaper rash cream for skin irritation.  4. Patient desires to change her GYN care from PCP to [facility].  Will get patient to sign record release.   Rubie Maid, MD [facility]

## 2016-02-07 DIAGNOSIS — E538 Deficiency of other specified B group vitamins: Secondary | ICD-10-CM | POA: Insufficient documentation

## 2016-04-29 ENCOUNTER — Ambulatory Visit (INDEPENDENT_AMBULATORY_CARE_PROVIDER_SITE_OTHER): Payer: PRIVATE HEALTH INSURANCE | Admitting: Obstetrics and Gynecology

## 2016-04-29 ENCOUNTER — Encounter: Payer: Self-pay | Admitting: Obstetrics and Gynecology

## 2016-04-29 VITALS — BP 130/78 | HR 70 | Ht 68.0 in | Wt 317.7 lb

## 2016-04-29 DIAGNOSIS — R071 Chest pain on breathing: Secondary | ICD-10-CM

## 2016-04-29 DIAGNOSIS — Z803 Family history of malignant neoplasm of breast: Secondary | ICD-10-CM | POA: Diagnosis not present

## 2016-04-29 DIAGNOSIS — N644 Mastodynia: Secondary | ICD-10-CM

## 2016-04-29 DIAGNOSIS — R0789 Other chest pain: Secondary | ICD-10-CM

## 2016-04-29 NOTE — Patient Instructions (Addendum)
Breast Tenderness Breast tenderness is a common problem for women of all ages. Breast tenderness may cause mild discomfort to severe pain. The pain usually comes and goes in association with your menstrual cycle, but it can be constant. Breast tenderness has many possible causes, including hormone changes and some medicines. Your health care provider may order tests, such as a mammogram or an ultrasound, to check for any unusual findings. Having breast tenderness usually does not mean that you have breast cancer. Follow these instructions at home: Sometimes, reassurance that you do not have breast cancer is all that is needed. In general, follow these home care instructions: Managing pain and discomfort  If directed, apply ice to the area:  Put ice in a plastic bag.  Place a towel between your skin and the bag.  Leave the ice on for 20 minutes, 2-3 times a day.  Make sure you are wearing a supportive bra, especially during exercise. You may also want to wear a supportive bra while sleeping if your breasts are very tender. Medicines  Take over-the-counter and prescription medicines only as told by your health care provider. If the cause of your pain is infection, you may be prescribed an antibiotic medicine.  If you were prescribed an antibiotic, take it as told by your health care provider. Do not stop taking the antibiotic even if you start to feel better. General instructions  Your health care provider may recommend that you reduce the amount of fat in your diet. You can do this by:  Limiting fried foods.  Cooking foods using methods, such as baking, boiling, grilling, and broiling.  Decrease the amount of caffeine in your diet. You can do this by drinking more water and choosing caffeine-free options.  Keep a log of the days and times when your breasts are most tender.  Ask your health care provider how to do breast exams at home. This will help you notice if you have an unusual  growth or lump. Contact a health care provider if:  Any part of your breast is hard, red, and hot to the touch. This may be a sign of infection.  You are not breastfeeding and you have fluid, especially blood or pus, coming out of your nipples.  You have a fever.  You have a new or painful lump in your breast that remains after your menstrual period ends.  Your pain does not improve or it gets worse.  Your pain is interfering with your daily activities. This information is not intended to replace advice given to you by your health care provider. Make sure you discuss any questions you have with your health care provider. Document Released: 03/20/2008 Document Revised: 01/04/2016 Document Reviewed: 01/04/2016 Elsevier Interactive Patient Education  2017 [employer]  Costochondritis Costochondritis is swelling and irritation (inflammation) of the tissue (cartilage) that connects your ribs to your breastbone (sternum). This causes pain in the front of your chest. The pain usually starts gradually and involves more than one rib. What are the causes? The exact cause of this condition is not always known. It results from stress on the cartilage where your ribs attach to your sternum. The cause of this stress could be:  Chest injury (trauma).  Exercise or activity, such as lifting.  Severe coughing. What increases the risk? You may be at higher risk for this condition if you:  Are female.  Are 106?56 years old.  Recently started a new exercise or work activity.  Have low levels of  vitamin D.  Have a condition that makes you cough frequently. What are the signs or symptoms? The main symptom of this condition is chest pain. The pain:  Usually starts gradually and can be sharp or dull.  Gets worse with deep breathing, coughing, or exercise.  Gets better with rest.  May be worse when you press on the sternum-rib connection (tenderness). How is this diagnosed? This condition  is diagnosed based on your symptoms, medical history, and a physical exam. Your health care provider will check for tenderness when pressing on your sternum. This is the most important finding. You may also have tests to rule out other causes of chest pain. These may include:  A chest X-ray to check for lung problems.  An electrocardiogram (ECG) to see if you have a heart problem that could be causing the pain.  An imaging scan to rule out a chest or rib fracture. How is this treated? This condition usually goes away on its own over time. Your health care provider may prescribe an NSAID to reduce pain and inflammation. Your health care provider may also suggest that you:  Rest and avoid activities that make pain worse.  Apply heat or cold to the area to reduce pain and inflammation.  Do exercises to stretch your chest muscles. If these treatments do not help, your health care provider may inject a numbing medicine at the sternum-rib connection to help relieve the pain. Follow these instructions at home:  Avoid activities that make pain worse. This includes any activities that use chest, abdominal, and side muscles.  If directed, put ice on the painful area:  Put ice in a plastic bag.  Place a towel between your skin and the bag.  Leave the ice on for 20 minutes, 2-3 times a day.  If directed, apply heat to the affected area as often as told by your health care provider. Use the heat source that your health care provider recommends, such as a moist heat pack or a heating pad.  Place a towel between your skin and the heat source.  Leave the heat on for 20-30 minutes.  Remove the heat if your skin turns bright red. This is especially important if you are unable to feel pain, heat, or cold. You may have a greater risk of getting burned.  Take over-the-counter and prescription medicines only as told by your health care provider.  Return to your normal activities as told by your  health care provider. Ask your health care provider what activities are safe for you.  Keep all follow-up visits as told by your health care provider. This is important. Contact a health care provider if:  You have chills or a fever.  Your pain does not go away or it gets worse.  You have a cough that does not go away (is persistent). Get help right away if:  You have shortness of breath. This information is not intended to replace advice given to you by your health care provider. Make sure you discuss any questions you have with your health care provider. Document Released: 01/15/2005 Document Revised: 10/26/2015 Document Reviewed: 08/01/2015 Elsevier Interactive Patient Education  2017 [employer].

## 2016-04-29 NOTE — Progress Notes (Signed)
    GYNECOLOGY CLINIC PROGRESS NOTE   Subjective:     Christine Moody is an 56 y.o. female who presents for evaluation of a left sided breast pain. Patient reports that this pain has been ongoing for several weeks (since around Christmas time). The pain is located in the sternal region.  Patient denies any breast masses or nipple discharge.  Notes that pain is intermittent. Denies any recent trauma to the chest or strenuous exercise.  The pain is not associated with a menstrual cycle (patient has had a hysterectomy).   Breast cancer risk factors include: mom with breast CA (diagnosed age 12).  Patient has not had a mammogram since 2015.   The following portions of the patient's history were reviewed and updated as appropriate: allergies, current medications, past family history, past medical history, past social history, past surgical history and problem list.  Review of Systems A comprehensive review of systems was negative except for: Integument/breast: positive for breast tenderness     Objective:    BP 130/78 (BP Location: Left Arm, Patient Position: Sitting, Cuff Size: Large)   Pulse 70   Ht 5\' 8"  (1.727 m)   Wt (!) 317 lb 11.2 oz (144.1 kg)   BMI 48.31 kg/m  General appearance: alert and no distress Lungs: clear to auscultation bilaterally Breasts: normal appearance, no masses or tenderness, No nipple retraction or dimpling, No nipple discharge or bleeding Heart: regular rate and rhythm, S1, S2 normal, no murmur, click, rub or gallop.  Mild tenderness to palpation in middle of chest wall (sternum near xiphoid process, closer to left breast) Lymph nodes: Cervical, supraclavicular, and axillary nodes normal.    Assessment:   Mastalgia with normal breast exam Possible costochondritis Family h/o breast cancer   Plan:    Reassured the patient that the finding is most likely benign. Discussed need for futher evaluation. Arranged for mammogram. Started OTC analgesics.   To f/u  if symptoms worsen.     Rubie Maid, MD [facility]

## 2016-05-01 ENCOUNTER — Other Ambulatory Visit: Payer: Self-pay | Admitting: Obstetrics and Gynecology

## 2016-05-01 DIAGNOSIS — Z1231 Encounter for screening mammogram for malignant neoplasm of breast: Secondary | ICD-10-CM

## 2016-05-06 ENCOUNTER — Ambulatory Visit
Admission: RE | Admit: 2016-05-06 | Discharge: 2016-05-06 | Disposition: A | Discharge status: Home / Self Care | Payer: PRIVATE HEALTH INSURANCE | Source: Ambulatory Visit | Attending: Obstetrics and Gynecology | Admitting: Obstetrics and Gynecology

## 2016-05-06 DIAGNOSIS — Z1231 Encounter for screening mammogram for malignant neoplasm of breast: Secondary | ICD-10-CM | POA: Diagnosis present

## 2016-05-22 DIAGNOSIS — L609 Nail disorder, unspecified: Secondary | ICD-10-CM | POA: Insufficient documentation

## 2017-03-17 ENCOUNTER — Ambulatory Visit
Admission: EM | Admit: 2017-03-17 | Discharge: 2017-03-17 | Disposition: A | Discharge status: Home / Self Care | Payer: PRIVATE HEALTH INSURANCE | Attending: Emergency Medicine | Admitting: Emergency Medicine

## 2017-03-17 ENCOUNTER — Other Ambulatory Visit: Payer: Self-pay

## 2017-03-17 DIAGNOSIS — J069 Acute upper respiratory infection, unspecified: Secondary | ICD-10-CM | POA: Diagnosis not present

## 2017-03-17 DIAGNOSIS — J0140 Acute pansinusitis, unspecified: Secondary | ICD-10-CM | POA: Diagnosis not present

## 2017-03-17 NOTE — ED Triage Notes (Signed)
Patient complains of cough, congestion, sinus pressure and pain, ear pain x Sunday pm. Patient states that she has been taking Ibuprofen and Advil cold and flu.

## 2017-03-17 NOTE — Discharge Instructions (Signed)
Take the medication as written. Return to the ER if you get worse, have a fever >100.4, or for any concerns. You may take 600 mg of motrin with 1 gram of tylenol up to 3-4 times a day as needed for pain. This is an effective combination for pain.  Most sinus infections are viral and do not need antibiotics unless you have a high fever, have had this for 10 days, or you get better and then get sick again. Use a neti pot or the NeilMed sinus rinse as often as you want to to reduce nasal congestion. Follow the directions on the box.   Go to www.goodrx.com to look up your medications. This will give you a list of where you can find your prescriptions at the most affordable prices. Or you can ask the pharmacist what the cash price is. This is frequently cheaper than going through insurance.

## 2017-03-17 NOTE — ED Provider Notes (Signed)
HPI  SUBJECTIVE:  Christine Moody is a 56 y.o. female who presents with nasal congestion, sinus pain and pressure behind her eyes, rhinorrhea, postnasal drip, bilateral ear pressure/fullness and a sore throat for 2 days.  Reports frontal headache.  She reports an occasional cough and watery eyes but denies itchy eyes or sneezing.  She has tried ibuprofen and Advil cold and flu with improvement in her symptoms.  Symptoms are worse with lying down.  No change in hearing, otorrhea.  No fevers.  No upper dental pain.  No voice changes, sensation of throat swelling shut, drooling, trismus, difficulty breathing.  No wheezing, shortness of breath.  No antibiotics in the past month.  She took an antipyretic within 6-8 hours of evaluation.  She states that her granddaughter had identical URI symptoms recently.  She has a past medical history of allergies which bother her at this time of year but states that this does not feel like her usual allergies.  Also history of hypertension, sinusitis.  She is status post tonsillectomy.  No history of diabetes.  DPO:EUMPNT, [place]     Past Medical History:  Diagnosis Date  . Depression   . Hypertension   . Menopause   . Obese   . Plantar fasciitis   . Thyroid disease   . Trigeminal neuralgia     Past Surgical History:  Procedure Laterality Date  . ABDOMINAL HYSTERECTOMY     [facility]- DR [place]-D/T- HEAVY MENSES  . CESAREAN SECTION    . CHOLECYSTECTOMY    . FACIAL SIALOCELE MARSUPILIZATION      Family History  Problem Relation Age of Onset  . Breast cancer Mother   . Cancer Mother 39       breast  . Coronary artery disease Father   . Cancer Father        prostate  . Heart disease Father   . Gout Father     Social History   Tobacco Use  . Smoking status: Former Smoker    Last attempt to quit: 04/21/1990    Years since quitting: 26.9  . Smokeless tobacco: Never Used  Substance Use Topics  . Alcohol use: No  . Drug  use: No    No current facility-administered medications for this encounter.   Current Outpatient Medications:  .  aspirin EC 81 MG tablet, Take 81 mg by mouth daily., Disp: , Rfl:  .  citalopram (CELEXA) 40 MG tablet, Take by mouth., Disp: , Rfl:  .  levothyroxine (SYNTHROID, LEVOTHROID) 125 MCG tablet, Take 125 mcg by mouth daily before breakfast., Disp: , Rfl:  .  lisinopril (PRINIVIL,ZESTRIL) 10 MG tablet, Take 10 mg by mouth daily., Disp: , Rfl:  .  vitamin B-12 (CYANOCOBALAMIN) 1000 MCG tablet, Take by mouth., Disp: , Rfl:   Allergies  Allergen Reactions  . Penicillins Rash and Hives     ROS  As noted in HPI.   Physical Exam  BP 122/75 (BP Location: Left Arm)   Pulse 78   Temp 98.7 F (37.1 C) (Oral)   Resp 18   Ht 5\' 8"  (1.727 m)   Wt 300 lb (136.1 kg)   SpO2 98%   BMI 45.61 kg/m   Constitutional: Well developed, well nourished, no acute distress Eyes:  EOMI, conjunctiva normal bilaterally HENT: Normocephalic, atraumatic,mucus membranes moist.  TMs normal bilaterally.  No effusion.  Positive mucoid  nasal congestion.  Red, swollen turbinates.  Positive maxillary and frontal sinus tenderness.  Positive postnasal  drip.  Erythematous oropharynx, tonsils surgically absent.  Neck: No cervical lymphadenopathy Respiratory: Normal inspiratory effort Cardiovascular: Normal rate GI: nondistended skin: No rash, skin intact Musculoskeletal: no deformities Neurologic: Alert & oriented x 3, no focal neuro deficits Psychiatric: Speech and behavior appropriate   ED Course   Medications - No data to display  No orders of the defined types were placed in this encounter.   No results found for this or any previous visit (from the past 24 hour(s)). No results found.  ED Clinical Impression  Upper respiratory tract infection, unspecified type  Acute pansinusitis, recurrence not specified   ED Assessment/Plan  Presentation consistent with a sinusitis, most likely  viral.  She does not have any clear indications for antibiotics at this time..  Feel that the sore throat is from the postnasal drip.  Home with ibuprofen 600 mg 1 g of Tylenol, she is to restart her Flonase, states that she does not need a prescription, Mucinex D, advised her to watch her blood pressure while taking this.  She will switch to regular Mucinex if this elevates her blood pressure, she is to start saline nasal irrigation.  Also home with a wait-and-see prescription of doxycycline, advised patient to not fill it now.  Discussed indications for starting the antibiotic.  Also work note.  Follow-up with PMD as needed, to the ER if she gets worse.  Discussed medical decision making and plan for follow-up with patient.  She agrees with plan.  No orders of the defined types were placed in this encounter.   *This clinic note was created using Dragon dictation software. Therefore, there may be occasional mistakes despite careful proofreading.   ?    Melynda Ripple, MD 03/17/17 1302

## 2017-07-09 ENCOUNTER — Ambulatory Visit (INDEPENDENT_AMBULATORY_CARE_PROVIDER_SITE_OTHER): Payer: PRIVATE HEALTH INSURANCE | Admitting: Obstetrics and Gynecology

## 2017-07-09 ENCOUNTER — Encounter: Payer: Self-pay | Admitting: Obstetrics and Gynecology

## 2017-07-09 VITALS — BP 119/81 | HR 66 | Ht 68.0 in | Wt 317.8 lb

## 2017-07-09 DIAGNOSIS — L292 Pruritus vulvae: Secondary | ICD-10-CM

## 2017-07-09 DIAGNOSIS — E039 Hypothyroidism, unspecified: Secondary | ICD-10-CM

## 2017-07-09 DIAGNOSIS — Z13220 Encounter for screening for lipoid disorders: Secondary | ICD-10-CM

## 2017-07-09 DIAGNOSIS — Z124 Encounter for screening for malignant neoplasm of cervix: Secondary | ICD-10-CM

## 2017-07-09 DIAGNOSIS — L659 Nonscarring hair loss, unspecified: Secondary | ICD-10-CM | POA: Diagnosis not present

## 2017-07-09 DIAGNOSIS — Z1239 Encounter for other screening for malignant neoplasm of breast: Secondary | ICD-10-CM

## 2017-07-09 DIAGNOSIS — Z01419 Encounter for gynecological examination (general) (routine) without abnormal findings: Secondary | ICD-10-CM

## 2017-07-09 DIAGNOSIS — Z1211 Encounter for screening for malignant neoplasm of colon: Secondary | ICD-10-CM | POA: Diagnosis not present

## 2017-07-09 DIAGNOSIS — Z1231 Encounter for screening mammogram for malignant neoplasm of breast: Secondary | ICD-10-CM

## 2017-07-09 NOTE — Progress Notes (Signed)
GYNECOLOGY ANNUAL PHYSICAL EXAM PROGRESS NOTE  Subjective:    Christine Moody is a 57 y.o. G15P2000 female who presents for an annual exam. The patient is sexually active. The patient wears seatbelts: yes. The patient participates in regular exercise: no (but reports intermittent episodes of exercising). Has the patient ever been transfused or tattooed?: no. The patient reports that there is not domestic violence in her life.    The patient has the following complaints today:  1. Mild intermittent vulvar itching near mons pubis. Has been ongoing for several weeks. Denies any color changes or rashes.  2. She reports a spot on her scalp where hair has stopped growing, and thinks she feels something there.    Gynecologic History No LMP recorded. Patient has had a hysterectomy.  Menarche age: 27 Contraception: status post hysterectomy History of STI's: Denies Last Pap: 07/22/2012. Results were: normal.  Denies h/o abnormal pap smears. Last mammogram: 04/2016. Results were: normal Last colonoscopy: patient has never had one  OB History  Gravida Para Term Preterm AB Living  2 2 2  0 0 0  SAB TAB Ectopic Multiple Live Births  0 0 0 0 0    # Outcome Date GA Lbr Len/2nd Weight Sex Delivery Anes PTL Lv  2 Term 37    F Vag-Spont     1 Term 75    F Vag-Spont       Past Medical History:  Diagnosis Date  . Depression   . Hypertension   . Menopause   . Obese   . Plantar fasciitis   . Thyroid disease   . Trigeminal neuralgia     Past Surgical History:  Procedure Laterality Date  . ABDOMINAL HYSTERECTOMY     LSH- DR WASHINGTON-D/T- HEAVY MENSES  . CHOLECYSTECTOMY    . FACIAL SIALOCELE MARSUPILIZATION      Family History  Problem Relation Age of Onset  . Breast cancer Mother 45  . Coronary artery disease Father   . Cancer Father        prostate  . Heart disease Father   . Gout Father     Social History   Socioeconomic History  . Marital status: Married    Spouse  name: Not on file  . Number of children: Not on file  . Years of education: Not on file  . Highest education level: Not on file  Occupational History  . Not on file  Social Needs  . Financial resource strain: Not on file  . Food insecurity:    Worry: Not on file    Inability: Not on file  . Transportation needs:    Medical: Not on file    Non-medical: Not on file  Tobacco Use  . Smoking status: Former Smoker    Last attempt to quit: 04/21/1990    Years since quitting: 27.2  . Smokeless tobacco: Never Used  Substance and Sexual Activity  . Alcohol use: No  . Drug use: No  . Sexual activity: Not Currently    Birth control/protection: Surgical    Comment: HUSBAND VASECTOMY  Lifestyle  . Physical activity:    Days per week: Not on file    Minutes per session: Not on file  . Stress: Not on file  Relationships  . Social connections:    Talks on phone: Not on file    Gets together: Not on file    Attends religious service: Not on file    Active member of club or organization:  Not on file    Attends meetings of clubs or organizations: Not on file    Relationship status: Not on file  . Intimate partner violence:    Fear of current or ex partner: Not on file    Emotionally abused: Not on file    Physically abused: Not on file    Forced sexual activity: Not on file  Other Topics Concern  . Not on file  Social History Narrative  . Not on file    Current Outpatient Medications on File Prior to Visit  Medication Sig Dispense Refill  . aspirin EC 81 MG tablet Take 81 mg by mouth daily.    . citalopram (CELEXA) 40 MG tablet Take by mouth.    . levothyroxine (SYNTHROID, LEVOTHROID) 125 MCG tablet Take 125 mcg by mouth daily before breakfast.    . lisinopril (PRINIVIL,ZESTRIL) 10 MG tablet Take 10 mg by mouth daily.    . vitamin B-12 (CYANOCOBALAMIN) 1000 MCG tablet Take by mouth.     No current facility-administered medications on file prior to visit.     Allergies  Allergen  Reactions  . Penicillins Rash and Hives    Review of Systems Constitutional: negative for chills, fatigue, fevers and sweats Eyes: negative for irritation, redness and visual disturbance Ears, nose, mouth, throat, and face: negative for hearing loss, nasal congestion, snoring and tinnitus Respiratory: negative for asthma, cough, sputum Cardiovascular: negative for chest pain, dyspnea, exertional chest pressure/discomfort, irregular heart beat, palpitations and syncope Gastrointestinal: negative for abdominal pain, change in bowel habits, nausea and vomiting Genitourinary: vulvar itching near mons pubis (see HPI).  Negative for abnormal menstrual periods, genital lesions, sexual problems and vaginal discharge, dysuria and urinary incontinence Integument/breast: negative for breast lump, breast tenderness and nipple discharge Hematologic/lymphatic: negative for bleeding and easy bruising Musculoskeletal:negative for back pain and muscle weakness Neurological: negative for dizziness, headaches, vertigo and weakness Endocrine: negative for diabetic symptoms including polydipsia, polyuria and skin dryness Allergic/Immunologic: negative for hay fever and urticaria    Skin: no sores or suspicious lesions or rashes or color changes.  Positive for small area of alopecia on scalp.      Objective:  Blood pressure 119/81, pulse 66, height 5\' 8"  (1.727 m), weight (!) 317 lb 12.8 oz (144.2 kg). Body mass index is 48.32 kg/m.  General Appearance:    Alert, cooperative, no distress, appears stated age  Head:    Normocephalic, without obvious abnormality, atraumatic.  Small area (1 x 1 cm of scalp with alopecia near front of scalp, no lesions).   Eyes:    PERRL, conjunctiva/corneas clear, EOM's intact, both eyes  Ears:    Normal external ear canals, both ears  Nose:   Nares normal, septum midline, mucosa normal, no drainage or sinus tenderness  Throat:   Lips, mucosa, and tongue normal; teeth and gums  normal  Neck:   Supple, symmetrical, trachea midline, no adenopathy; thyroid: no enlargement/tenderness/nodules; no carotid bruit or JVD  Back:     Symmetric, no curvature, ROM normal, no CVA tenderness  Lungs:     Clear to auscultation bilaterally, respirations unlabored  Chest Wall:    No tenderness or deformity   Heart:    Regular rate and rhythm, S1 and S2 normal, no murmur, rub or gallop  Breast Exam:    No tenderness, masses, or nipple abnormality  Abdomen:     Soft, non-tender, bowel sounds active all four quadrants, no masses, no organomegaly.    Genitalia:  Pelvic:external genitalia normal, vagina without lesions, discharge, or tenderness, rectovaginal septum  normal. Cervix normal in appearance, no cervical motion tenderness, no adnexal masses or tenderness.  Uterus normal size, shape, mobile, regular contours, nontender.  Rectal:    Normal external sphincter.  No hemorrhoids appreciated. Internal exam not done.   Extremities:   Extremities normal, atraumatic, no cyanosis or edema  Pulses:   2+ and symmetric all extremities  Skin:   Skin color, texture, turgor normal, no rashes or lesions  Lymph nodes:   Cervical, supraclavicular, and axillary nodes normal  Neurologic:   CNII-XII intact, normal strength, sensation and reflexes throughout    Labs:  No results found for: WBC, HGB, HCT, MCV, PLT  No results found for: CREATININE, BUN, NA, K, CL, CO2  No results found for: ALT, AST, GGT, ALKPHOS, BILITOT  No results found for: TSH   Assessment:   Routine gynecologic exam.  Vulvar itching Alopecia Morbid obesity Multiple comorbidities  Plan:     Blood tests: CBC with diff, Comprehensive metabolic panel, Lipoproteins and TSH. Breast self exam technique reviewed and patient encouraged to perform self-exam monthly. Contraception: status post hysterectomy. Discussed healthy lifestyle modifications. Mammogram ordered. Referral to GI for colonoscopy.  Pap smear performed  as patient still retains cervix after hysterectomy.   Multiple comorbidities (HTN, hypothyroidism, depression) to be managed by PCP.  Small area of alopecia noted, no rashes visible.  Patient notes she is not majorly concerned regarding the small patch of hair loss, just wanted to be sure it wasn't infectious or lesion present.  Vulvar itching, no rashes or lesion present.  Advised on use of coconut oil or aloe to area.   Declines flu vaccine.  F/u in 1 year for annual exam.    Rubie Maid, MD [facility]

## 2017-07-09 NOTE — Progress Notes (Signed)
Pt is noticed some itching in her vaginal area outside.

## 2017-07-10 ENCOUNTER — Telehealth: Payer: Self-pay

## 2017-07-10 LAB — CBC
Hematocrit: 45 % (ref 34.0–46.6)
Hemoglobin: 15.5 g/dL (ref 11.1–15.9)
MCH: 31.8 pg (ref 26.6–33.0)
MCHC: 34.4 g/dL (ref 31.5–35.7)
MCV: 92 fL (ref 79–97)
PLATELETS: 223 10*3/uL (ref 150–379)
RBC: 4.88 x10E6/uL (ref 3.77–5.28)
RDW: 13.7 % (ref 12.3–15.4)
WBC: 6.3 10*3/uL (ref 3.4–10.8)

## 2017-07-10 LAB — COMPREHENSIVE METABOLIC PANEL
A/G RATIO: 2.1 (ref 1.2–2.2)
ALBUMIN: 4.7 g/dL (ref 3.5–5.5)
ALT: 22 IU/L (ref 0–32)
AST: 21 IU/L (ref 0–40)
Alkaline Phosphatase: 59 IU/L (ref 39–117)
BILIRUBIN TOTAL: 0.6 mg/dL (ref 0.0–1.2)
BUN/Creatinine Ratio: 13 (ref 9–23)
BUN: 11 mg/dL (ref 6–24)
CHLORIDE: 101 mmol/L (ref 96–106)
CO2: 25 mmol/L (ref 20–29)
Calcium: 9.4 mg/dL (ref 8.7–10.2)
Creatinine, Ser: 0.83 mg/dL (ref 0.57–1.00)
GFR calc non Af Amer: 79 mL/min/{1.73_m2} (ref >59)
GFR, EST AFRICAN AMERICAN: 91 mL/min/{1.73_m2} (ref >59)
Globulin, Total: 2.2 g/dL (ref 1.5–4.5)
Glucose: 89 mg/dL (ref 65–99)
POTASSIUM: 5.1 mmol/L (ref 3.5–5.2)
SODIUM: 141 mmol/L (ref 134–144)
TOTAL PROTEIN: 6.9 g/dL (ref 6.0–8.5)

## 2017-07-10 LAB — LIPID PANEL
CHOL/HDL RATIO: 3.7 ratio (ref 0.0–4.4)
Cholesterol, Total: 228 mg/dL — ABNORMAL HIGH (ref 100–199)
HDL: 61 mg/dL (ref >39)
LDL CALC: 138 mg/dL — AB (ref 0–99)
TRIGLYCERIDES: 146 mg/dL (ref 0–149)
VLDL Cholesterol Cal: 29 mg/dL (ref 5–40)

## 2017-07-10 LAB — TSH: TSH: 2.4 u[IU]/mL (ref 0.450–4.500)

## 2017-07-10 NOTE — Telephone Encounter (Signed)
-----   Message from Rubie Maid, MD sent at 07/10/2017  3:11 PM EDT ----- Please inform patient of normal labs except that her LDL and total cholesterol are a mildly elevated.  She does not require medications, but would continue to encourage a low-fat, low-cholesterol diet, and exercise for 30 minutes daily at least 3-5 times weekly.

## 2017-07-10 NOTE — Telephone Encounter (Signed)
Pt is aware.  

## 2017-07-11 ENCOUNTER — Encounter: Payer: Self-pay | Admitting: Obstetrics and Gynecology

## 2017-07-13 ENCOUNTER — Telehealth: Payer: Self-pay | Admitting: Gastroenterology

## 2017-07-13 NOTE — Telephone Encounter (Signed)
Gastroenterology Pre-Procedure Review  Request Date:   Requesting Physician: Dr.    PATIENT REVIEW QUESTIONS: The patient responded to the following health history questions as indicated:    1. Are you having any GI issues? no 2. Do you have a personal history of Polyps? no 3. Do you have a family history of Colon Cancer or Polyps? yes (Mother polyp) 4. Diabetes Mellitus? no 5. Joint replacements in the past 12 months?no 6. Major health problems in the past 3 months?no 7. Any artificial heart valves, MVP, or defibrillator?no    MEDICATIONS & ALLERGIES:    Patient reports the following regarding taking any anticoagulation/antiplatelet therapy:   Plavix, Coumadin, Eliquis, Xarelto, Lovenox, Pradaxa, Brilinta, or Effient? no Aspirin? yes (asa 81 mg)  Patient confirms/reports the following medications:  Current Outpatient Medications  Medication Sig Dispense Refill   aspirin EC 81 MG tablet Take 81 mg by mouth daily.     citalopram (CELEXA) 40 MG tablet Take by mouth.     levothyroxine (SYNTHROID, LEVOTHROID) 125 MCG tablet Take 125 mcg by mouth daily before breakfast.     lisinopril (PRINIVIL,ZESTRIL) 10 MG tablet Take 10 mg by mouth daily.     vitamin B-12 (CYANOCOBALAMIN) 1000 MCG tablet Take by mouth.     No current facility-administered medications for this visit.     Patient confirms/reports the following allergies:  Allergies  Allergen Reactions   Penicillins Rash and Hives    No orders of the defined types were placed in this encounter.   AUTHORIZATION INFORMATION Primary Insurance: 1D#: Group #:  Secondary Insurance: 1D#: Group #:  SCHEDULE INFORMATION: Date:  Time: Location:

## 2017-07-14 LAB — IGP, COBASHPV16/18
HPV 16: NEGATIVE
HPV 18: NEGATIVE
HPV OTHER HR TYPES: NEGATIVE
PAP SMEAR COMMENT: 0

## 2017-07-17 ENCOUNTER — Telehealth: Payer: Self-pay

## 2017-07-17 ENCOUNTER — Other Ambulatory Visit: Payer: Self-pay

## 2017-07-17 DIAGNOSIS — Z1211 Encounter for screening for malignant neoplasm of colon: Secondary | ICD-10-CM

## 2017-07-17 NOTE — Telephone Encounter (Signed)
LVM for patient to notify her that her Colonoscopy has been scheduled for Monday April 15th at [facility] with Dr. Allen Norris.  If this date is not good for her she has been asked to call our office to reschedule date.  Thanks [employer]

## 2017-08-05 ENCOUNTER — Ambulatory Visit
Admission: RE | Admit: 2017-08-05 | Discharge: 2017-08-05 | Disposition: A | Discharge status: Home / Self Care | Payer: PRIVATE HEALTH INSURANCE | Source: Ambulatory Visit | Attending: Obstetrics and Gynecology | Admitting: Obstetrics and Gynecology

## 2017-08-05 DIAGNOSIS — Z1231 Encounter for screening mammogram for malignant neoplasm of breast: Secondary | ICD-10-CM | POA: Insufficient documentation

## 2017-08-05 DIAGNOSIS — Z1239 Encounter for other screening for malignant neoplasm of breast: Secondary | ICD-10-CM

## 2017-08-10 ENCOUNTER — Ambulatory Visit: Payer: PRIVATE HEALTH INSURANCE | Admitting: Certified Registered"

## 2017-08-10 ENCOUNTER — Ambulatory Visit
Admission: RE | Admit: 2017-08-10 | Payer: PRIVATE HEALTH INSURANCE | Source: Ambulatory Visit | Admitting: Gastroenterology

## 2017-08-10 ENCOUNTER — Encounter: Payer: Self-pay | Admitting: Certified Registered"

## 2017-08-10 ENCOUNTER — Ambulatory Visit
Admission: RE | Admit: 2017-08-10 | Discharge: 2017-08-10 | Disposition: A | Discharge status: Home / Self Care | Payer: PRIVATE HEALTH INSURANCE | Source: Ambulatory Visit | Attending: Gastroenterology | Admitting: Gastroenterology

## 2017-08-10 ENCOUNTER — Encounter
Admission: RE | Disposition: A | Discharge status: Home / Self Care | Payer: Self-pay | Source: Ambulatory Visit | Attending: Gastroenterology

## 2017-08-10 ENCOUNTER — Encounter: Admission: RE | Payer: Self-pay | Source: Ambulatory Visit

## 2017-08-10 DIAGNOSIS — Z7982 Long term (current) use of aspirin: Secondary | ICD-10-CM | POA: Diagnosis not present

## 2017-08-10 DIAGNOSIS — Z8249 Family history of ischemic heart disease and other diseases of the circulatory system: Secondary | ICD-10-CM | POA: Insufficient documentation

## 2017-08-10 DIAGNOSIS — Z79899 Other long term (current) drug therapy: Secondary | ICD-10-CM | POA: Insufficient documentation

## 2017-08-10 DIAGNOSIS — M722 Plantar fascial fibromatosis: Secondary | ICD-10-CM | POA: Insufficient documentation

## 2017-08-10 DIAGNOSIS — E079 Disorder of thyroid, unspecified: Secondary | ICD-10-CM | POA: Diagnosis not present

## 2017-08-10 DIAGNOSIS — K644 Residual hemorrhoidal skin tags: Secondary | ICD-10-CM | POA: Insufficient documentation

## 2017-08-10 DIAGNOSIS — Z90710 Acquired absence of both cervix and uterus: Secondary | ICD-10-CM | POA: Diagnosis not present

## 2017-08-10 DIAGNOSIS — I10 Essential (primary) hypertension: Secondary | ICD-10-CM | POA: Diagnosis not present

## 2017-08-10 DIAGNOSIS — Z803 Family history of malignant neoplasm of breast: Secondary | ICD-10-CM | POA: Insufficient documentation

## 2017-08-10 DIAGNOSIS — Z6841 Body Mass Index (BMI) 40.0 and over, adult: Secondary | ICD-10-CM | POA: Diagnosis not present

## 2017-08-10 DIAGNOSIS — D120 Benign neoplasm of cecum: Secondary | ICD-10-CM | POA: Insufficient documentation

## 2017-08-10 DIAGNOSIS — Z1211 Encounter for screening for malignant neoplasm of colon: Secondary | ICD-10-CM

## 2017-08-10 DIAGNOSIS — F329 Major depressive disorder, single episode, unspecified: Secondary | ICD-10-CM | POA: Insufficient documentation

## 2017-08-10 DIAGNOSIS — Z8349 Family history of other endocrine, nutritional and metabolic diseases: Secondary | ICD-10-CM | POA: Diagnosis not present

## 2017-08-10 DIAGNOSIS — D125 Benign neoplasm of sigmoid colon: Secondary | ICD-10-CM | POA: Insufficient documentation

## 2017-08-10 DIAGNOSIS — Z9049 Acquired absence of other specified parts of digestive tract: Secondary | ICD-10-CM | POA: Diagnosis not present

## 2017-08-10 DIAGNOSIS — Z880 Allergy status to penicillin: Secondary | ICD-10-CM | POA: Diagnosis not present

## 2017-08-10 DIAGNOSIS — Z8042 Family history of malignant neoplasm of prostate: Secondary | ICD-10-CM | POA: Insufficient documentation

## 2017-08-10 DIAGNOSIS — Z87891 Personal history of nicotine dependence: Secondary | ICD-10-CM | POA: Insufficient documentation

## 2017-08-10 DIAGNOSIS — G500 Trigeminal neuralgia: Secondary | ICD-10-CM | POA: Diagnosis not present

## 2017-08-10 HISTORY — PX: COLONOSCOPY WITH PROPOFOL: SHX5780

## 2017-08-10 SURGERY — COLONOSCOPY WITH PROPOFOL
Anesthesia: General

## 2017-08-10 MED ORDER — SODIUM CHLORIDE 0.9 % IV SOLN
INTRAVENOUS | Status: DC | PRN
  Administered 2017-08-10: 09:00:00 via INTRAVENOUS

## 2017-08-10 MED ORDER — FENTANYL CITRATE (PF) 100 MCG/2ML IJ SOLN
INTRAMUSCULAR | Status: DC | PRN
  Administered 2017-08-10 (×2): 50 ug via INTRAVENOUS

## 2017-08-10 MED ORDER — PROPOFOL 10 MG/ML IV BOLUS
INTRAVENOUS | Status: DC | PRN
  Administered 2017-08-10: 100 mg via INTRAVENOUS
  Administered 2017-08-10: 50 mg via INTRAVENOUS
  Administered 2017-08-10: 100 mg via INTRAVENOUS
  Administered 2017-08-10: 50 mg via INTRAVENOUS

## 2017-08-10 MED ORDER — LIDOCAINE HCL (PF) 2 % IJ SOLN
INTRAMUSCULAR | Status: AC
  Filled 2017-08-10: qty 10

## 2017-08-10 MED ORDER — PROPOFOL 500 MG/50ML IV EMUL
INTRAVENOUS | Status: DC | PRN
  Administered 2017-08-10: 125 ug/kg/min via INTRAVENOUS

## 2017-08-10 MED ORDER — PROPOFOL 500 MG/50ML IV EMUL
INTRAVENOUS | Status: AC
  Filled 2017-08-10: qty 50

## 2017-08-10 MED ORDER — FENTANYL CITRATE (PF) 100 MCG/2ML IJ SOLN
INTRAMUSCULAR | Status: AC
  Filled 2017-08-10: qty 2

## 2017-08-10 MED ORDER — LIDOCAINE HCL (CARDIAC) PF 100 MG/5ML IV SOSY
PREFILLED_SYRINGE | INTRAVENOUS | Status: DC | PRN
  Administered 2017-08-10: 40 mg via INTRATRACHEAL

## 2017-08-10 NOTE — H&P (Signed)
Cephas Darby, MD [address]  [place]  [place], [place] [address]  Main: [phone]  Fax: [phone] Pager: [phone]  Primary Care Physician:  Denton Lank, MD Primary Gastroenterologist:  Dr. Cephas Darby  Pre-Procedure History & Physical: HPI:  Christine Moody is a 57 y.o. female is here for an colonoscopy.   Past Medical History:  Diagnosis Date  . Depression   . Hypertension   . Menopause   . Obese   . Plantar fasciitis   . Thyroid disease   . Trigeminal neuralgia     Past Surgical History:  Procedure Laterality Date  . ABDOMINAL HYSTERECTOMY     LSH- DR WASHINGTON-D/T- HEAVY MENSES  . CHOLECYSTECTOMY    . FACIAL SIALOCELE MARSUPILIZATION      Prior to Admission medications   Medication Sig Start Date End Date Taking? Authorizing Provider  aspirin EC 81 MG tablet Take 81 mg by mouth daily.   Yes [provider]  citalopram (CELEXA) 40 MG tablet Take by mouth. 02/21/08  Yes [provider]  levothyroxine (SYNTHROID, LEVOTHROID) 125 MCG tablet Take 125 mcg by mouth daily before breakfast.   Yes [provider]  lisinopril (PRINIVIL,ZESTRIL) 10 MG tablet Take 10 mg by mouth daily.   Yes [provider]  vitamin B-12 (CYANOCOBALAMIN) 1000 MCG tablet Take by mouth.   Yes [provider]    Allergies as of 08/10/2017 - Review Complete 08/10/2017  Allergen Reaction Noted  . Penicillins Rash and Hives 12/27/2014    Family History  Problem Relation Age of Onset  . Breast cancer Mother 64  . Coronary artery disease Father   . Cancer Father        prostate  . Heart disease Father   . Gout Father     Social History   Socioeconomic History  . Marital status: Married    Spouse name: Not on file  . Number of children: Not on file  . Years of education: Not on file  . Highest education level: Not on file  Occupational History  . Not on file  Social Needs  . Financial resource strain: Not on file    . Food insecurity:    Worry: Not on file    Inability: Not on file  . Transportation needs:    Medical: Not on file    Non-medical: Not on file  Tobacco Use  . Smoking status: Former Smoker    Last attempt to quit: 04/21/1990    Years since quitting: 27.3  . Smokeless tobacco: Never Used  Substance and Sexual Activity  . Alcohol use: No  . Drug use: No  . Sexual activity: Not Currently    Birth control/protection: Surgical    Comment: HUSBAND VASECTOMY  Lifestyle  . Physical activity:    Days per week: Not on file    Minutes per session: Not on file  . Stress: Not on file  Relationships  . Social connections:    Talks on phone: Not on file    Gets together: Not on file    Attends religious service: Not on file    Active member of club or organization: Not on file    Attends meetings of clubs or organizations: Not on file    Relationship status: Not on file  . Intimate partner violence:    Fear of current or ex partner: Not on file    Emotionally abused: Not on file    Physically abused: Not on file  Forced sexual activity: Not on file  Other Topics Concern  . Not on file  Social History Narrative  . Not on file    Review of Systems: See HPI, otherwise negative ROS  Physical Exam: BP 129/85   Pulse 67   Temp 98.1 F (36.7 C) (Oral)   Resp 18   Ht 5\' 8"  (1.727 m)   Wt (!) 310 lb (140.6 kg)   BMI 47.14 kg/m  General:   Alert,  pleasant and cooperative in NAD Head:  Normocephalic and atraumatic. Neck:  Supple; no masses or thyromegaly. Lungs:  Clear throughout to auscultation.    Heart:  Regular rate and rhythm. Abdomen:  Soft, nontender and nondistended. Normal bowel sounds, without guarding, and without rebound.   Neurologic:  Alert and  oriented x4;  grossly normal neurologically.  Impression/Plan: [name] is here for an colonoscopy to be performed for colon cancer screening  Risks, benefits, limitations, and alternatives regarding   colonoscopy have been reviewed with the patient.  Questions have been answered.  All parties agreeable.   Sherri Sear, MD  08/10/2017, 9:05 AM

## 2017-08-10 NOTE — Anesthesia Postprocedure Evaluation (Signed)
Anesthesia Post Note  Patient: EVOLETT SOMARRIBA  Procedure(s) Performed: COLONOSCOPY WITH PROPOFOL (N/A )  Patient location during evaluation: Endoscopy Anesthesia Type: General Level of consciousness: awake and alert, oriented and patient cooperative Pain management: satisfactory to patient Vital Signs Assessment: post-procedure vital signs reviewed and stable Respiratory status: spontaneous breathing and respiratory function stable Cardiovascular status: stable and blood pressure returned to baseline Postop Assessment: no headache, no backache, patient able to bend at knees, no apparent nausea or vomiting and adequate PO intake Anesthetic complications: no     Last Vitals:  Vitals:   08/10/17 0836 08/10/17 0943  BP: 129/85 110/79  Pulse: 67 97  Resp: 18 18  Temp: 36.7 C (!) 36.1 C  SpO2:  97%    Last Pain:  Vitals:   08/10/17 0943  TempSrc: Tympanic  PainSc:                  Clovis Riley Tadd Holtmeyer

## 2017-08-10 NOTE — Anesthesia Preprocedure Evaluation (Signed)
Anesthesia Evaluation  Patient identified by MRN, date of birth, ID band Patient awake    Reviewed: Allergy & Precautions, H&P , NPO status , Patient's Chart, lab work & pertinent test results, reviewed documented beta blocker date and time   History of Anesthesia Complications Negative for: history of anesthetic complications  Airway Mallampati: I  TM Distance: >3 FB Neck ROM: full    Dental no notable dental hx. (+) Caps, Chipped, Poor Dentition, Missing   Pulmonary neg pulmonary ROS, former smoker,           Cardiovascular Exercise Tolerance: Good hypertension, (-) angina(-) CAD, (-) Past MI, (-) Cardiac Stents and (-) CABG (-) dysrhythmias (-) Valvular Problems/Murmurs     Neuro/Psych PSYCHIATRIC DISORDERS Depression negative neurological ROS     GI/Hepatic negative GI ROS, Neg liver ROS,   Endo/Other  neg diabetesHypothyroidism Morbid obesity  Renal/GU negative Renal ROS  negative genitourinary   Musculoskeletal   Abdominal   Peds  Hematology negative hematology ROS (+)   Anesthesia Other Findings Past Medical History: No date: Depression No date: Hypertension No date: Menopause No date: Obese No date: Plantar fasciitis No date: Thyroid disease No date: Trigeminal neuralgia   Reproductive/Obstetrics negative OB ROS                             Anesthesia Physical Anesthesia Plan  ASA: III  Anesthesia Plan: General   Post-op Pain Management:    Induction: Intravenous  PONV Risk Score and Plan: 3 and Propofol infusion  Airway Management Planned: Natural Airway and Nasal Cannula  Additional Equipment:   Intra-op Plan:   Post-operative Plan:   Informed Consent: I have reviewed the patients History and Physical, chart, labs and discussed the procedure including the risks, benefits and alternatives for the proposed anesthesia with the patient or authorized representative  who has indicated his/her understanding and acceptance.   Dental Advisory Given  Plan Discussed with: Anesthesiologist, CRNA and Surgeon  Anesthesia Plan Comments:         Anesthesia Quick Evaluation

## 2017-08-10 NOTE — Anesthesia Post-op Follow-up Note (Signed)
Anesthesia QCDR form completed.        

## 2017-08-10 NOTE — Op Note (Signed)
[facility] Gastroenterology Patient Name: Christine Moody Procedure Date: 08/10/2017 9:07 AM MRN: [id number] Account #: [id number] Date of Birth: [birth year] Admit Type: Outpatient Age: 57 Room: [facility] ENDO ROOM 3 Gender: Female Note Status: Finalized Procedure:            Colonoscopy Indications:          Screening for colorectal malignant neoplasm, This is                        the patient's first colonoscopy Providers:            Lin Landsman MD, MD Referring MD:         Denton Lank MD, MD (Referring MD) Medicines:            Monitored Anesthesia Care Complications:        No immediate complications. Estimated blood loss:                        Minimal. Procedure:            Pre-Anesthesia Assessment:                       - Prior to the procedure, a History and Physical was                        performed, and patient medications and allergies were                        reviewed. The patient is competent. The risks and                        benefits of the procedure and the sedation options and                        risks were discussed with the patient. All questions                        were answered and informed consent was obtained.                        Patient identification and proposed procedure were                        verified by the physician, the nurse, the                        anesthesiologist, the anesthetist and the technician in                        the pre-procedure area in the procedure room in the                        endoscopy suite. Mental Status Examination: alert and                        oriented. Airway Examination: normal oropharyngeal                        airway and neck mobility. Respiratory Examination:  clear to auscultation. CV Examination: normal.                        Prophylactic Antibiotics: The patient does not require                        prophylactic antibiotics. Prior  Anticoagulants: The                        patient has taken aspirin, last dose was 5 days prior                        to procedure. ASA Grade Assessment: III - A patient                        with severe systemic disease. After reviewing the risks                        and benefits, the patient was deemed in satisfactory                        condition to undergo the procedure. The anesthesia plan                        was to use monitored anesthesia care (MAC). Immediately                        prior to administration of medications, the patient was                        re-assessed for adequacy to receive sedatives. The                        heart rate, respiratory rate, oxygen saturations, blood                        pressure, adequacy of pulmonary ventilation, and                        response to care were monitored throughout the                        procedure. The physical status of the patient was                        re-assessed after the procedure.                       After obtaining informed consent, the colonoscope was                        passed under direct vision. Throughout the procedure,                        the patient's blood pressure, pulse, and oxygen                        saturations were monitored continuously. The  Colonoscope was introduced through the anus and                        advanced to the the terminal ileum. The colonoscopy was                        performed with moderate difficulty due to significant                        looping and the patient's body habitus. Successful                        completion of the procedure was aided by applying                        abdominal pressure. The patient tolerated the procedure                        well. The quality of the bowel preparation was                        evaluated using the BBPS [facility] Bowel Preparation                        Scale) with scores of:  Right Colon = 3, Transverse                        Colon = 3 and Left Colon = 3 (entire mucosa seen well                        with no residual staining, small fragments of stool or                        opaque liquid). The total BBPS score equals 9. Findings:      Skin tags were found on perianal exam.      The terminal ileum appeared normal.      Two sessile polyps were found in the sigmoid colon and cecum. The polyps       were 5 mm in size. These polyps were removed with a cold snare.       Resection and retrieval were complete.      The retroflexed view of the distal rectum and anal verge was normal and       showed no anal or rectal abnormalities.      The exam was otherwise without abnormality. Impression:           - Perianal skin tags found on perianal exam.                       - The examined portion of the ileum was normal.                       - Two 5 mm polyps in the sigmoid colon and in the                        cecum, removed with a cold snare. Resected and  retrieved.                       - The distal rectum and anal verge are normal on                        retroflexion view.                       - The examination was otherwise normal. Recommendation:       - Discharge patient to home.                       - Resume previous diet today.                       - Continue present medications.                       - Await pathology results.                       - Repeat colonoscopy in 5 years for surveillance. Procedure Code(s):    --- Professional ---                       [phone], Colonoscopy, flexible; with removal of tumor(s),                        polyp(s), or other lesion(s) by snare technique Diagnosis Code(s):    --- Professional ---                       Z12.11, Encounter for screening for malignant neoplasm                        of colon                       D12.5, Benign neoplasm of sigmoid colon                       D12.0,  Benign neoplasm of cecum                       K64.4, Residual hemorrhoidal skin tags CPT copyright 2017 [employer]. All rights reserved. The codes documented in this report are preliminary and upon coder review may  be revised to meet current compliance requirements. Dr. Ulyess Mort Lin Landsman MD, MD 08/10/2017 9:42:46 AM This report has been signed electronically. Number of Addenda: 0 Note Initiated On: 08/10/2017 9:07 AM Scope Withdrawal Time: 0 hours 14 minutes 7 seconds  Total Procedure Duration: 0 hours 23 minutes 3 seconds       [facility]

## 2017-08-10 NOTE — Transfer of Care (Signed)
Immediate Anesthesia Transfer of Care Note  Patient: Christine Moody  Procedure(s) Performed: COLONOSCOPY WITH PROPOFOL (N/A )  Patient Location: PACU and Endoscopy Unit  Anesthesia Type:General  Level of Consciousness: awake, alert , oriented and patient cooperative  Airway & Oxygen Therapy: Patient Spontanous Breathing  Post-op Assessment: Report given to RN, Post -op Vital signs reviewed and stable and Patient moving all extremities  Post vital signs: Reviewed and stable  Last Vitals:  Vitals Value Taken Time  BP 110/79 08/10/2017  9:44 AM  Temp 36.1 C 08/10/2017  9:43 AM  Pulse 76 08/10/2017  9:46 AM  Resp 19 08/10/2017  9:46 AM  SpO2 97 % 08/10/2017  9:46 AM  Vitals shown include unvalidated device data.  Last Pain:  Vitals:   08/10/17 0943  TempSrc: Tympanic  PainSc:          Complications: No apparent anesthesia complications

## 2017-08-11 ENCOUNTER — Encounter: Payer: Self-pay | Admitting: Gastroenterology

## 2017-08-11 LAB — SURGICAL PATHOLOGY

## 2018-07-13 ENCOUNTER — Encounter: Payer: PRIVATE HEALTH INSURANCE | Admitting: Obstetrics and Gynecology

## 2018-09-08 ENCOUNTER — Encounter: Payer: PRIVATE HEALTH INSURANCE | Admitting: Obstetrics and Gynecology

## 2018-09-24 ENCOUNTER — Telehealth: Payer: Self-pay | Admitting: Obstetrics and Gynecology

## 2018-09-24 DIAGNOSIS — Z1239 Encounter for other screening for malignant neoplasm of breast: Secondary | ICD-10-CM

## 2018-09-24 NOTE — Telephone Encounter (Signed)
The patient called and stated that she needs orders placed for her to get her mammogram at [facility]. Please advise.

## 2018-09-28 NOTE — Telephone Encounter (Signed)
Pt is was called and aware that her mammogram order had been placed.

## 2018-11-10 ENCOUNTER — Other Ambulatory Visit: Payer: Self-pay

## 2018-11-10 ENCOUNTER — Ambulatory Visit
Admission: RE | Admit: 2018-11-10 | Discharge: 2018-11-10 | Disposition: A | Discharge status: Home / Self Care | Payer: PRIVATE HEALTH INSURANCE | Source: Ambulatory Visit | Attending: Obstetrics and Gynecology | Admitting: Obstetrics and Gynecology

## 2018-11-10 DIAGNOSIS — Z1231 Encounter for screening mammogram for malignant neoplasm of breast: Secondary | ICD-10-CM | POA: Insufficient documentation

## 2018-11-10 DIAGNOSIS — Z1239 Encounter for other screening for malignant neoplasm of breast: Secondary | ICD-10-CM

## 2018-11-15 NOTE — Patient Instructions (Addendum)
Preventive Care 40-58 Years Old, Female °Preventive care refers to visits with your health care provider and lifestyle choices that can promote health and wellness. This includes: °· A yearly physical exam. This may also be called an annual well check. °· Regular dental visits and eye exams. °· Immunizations. °· Screening for certain conditions. °· Healthy lifestyle choices, such as eating a healthy diet, getting regular exercise, not using drugs or products that contain nicotine and tobacco, and limiting alcohol use. °What can I expect for my preventive care visit? °Physical exam °Your health care provider will check your: °· Height and weight. This may be used to calculate body mass index (BMI), which tells if you are at a healthy weight. °· Heart rate and blood pressure. °· Skin for abnormal spots. °Counseling °Your health care provider may ask you questions about your: °· Alcohol, tobacco, and drug use. °· Emotional well-being. °· Home and relationship well-being. °· Sexual activity. °· Eating habits. °· Work and work environment. °· Method of birth control. °· Menstrual cycle. °· Pregnancy history. °What immunizations do I need? ° °Influenza (flu) vaccine °· This is recommended every year. °Tetanus, diphtheria, and pertussis (Tdap) vaccine °· You may need a Td booster every 10 years. °Varicella (chickenpox) vaccine °· You may need this if you have not been vaccinated. °Zoster (shingles) vaccine °· You may need this after age 60. °Measles, mumps, and rubella (MMR) vaccine °· You may need at least one dose of MMR if you were born in 1957 or later. You may also need a second dose. °Pneumococcal conjugate (PCV13) vaccine °· You may need this if you have certain conditions and were not previously vaccinated. °Pneumococcal polysaccharide (PPSV23) vaccine °· You may need one or two doses if you smoke cigarettes or if you have certain conditions. °Meningococcal conjugate (MenACWY) vaccine °· You may need this if you  have certain conditions. °Hepatitis A vaccine °· You may need this if you have certain conditions or if you travel or work in places where you may be exposed to hepatitis A. °Hepatitis B vaccine °· You may need this if you have certain conditions or if you travel or work in places where you may be exposed to hepatitis B. °Haemophilus influenzae type b (Hib) vaccine °· You may need this if you have certain conditions. °Human papillomavirus (HPV) vaccine °· If recommended by your health care provider, you may need three doses over 6 months. °You may receive vaccines as individual doses or as more than one vaccine together in one shot (combination vaccines). Talk with your health care provider about the risks and benefits of combination vaccines. °What tests do I need? °Blood tests °· Lipid and cholesterol levels. These may be checked every 5 years, or more frequently if you are over 50 years old. °· Hepatitis C test. °· Hepatitis B test. °Screening °· Lung cancer screening. You may have this screening every year starting at age 55 if you have a 30-pack-year history of smoking and currently smoke or have quit within the past 15 years. °· Colorectal cancer screening. All adults should have this screening starting at age 50 and continuing until age 75. Your health care provider may recommend screening at age 45 if you are at increased risk. You will have tests every 1-10 years, depending on your results and the type of screening test. °· Diabetes screening. This is done by checking your blood sugar (glucose) after you have not eaten for a while (fasting). You may have this   done every 1-3 years.  Mammogram. This may be done every 1-2 years. Talk with your health care provider about when you should start having regular mammograms. This may depend on whether you have a family history of breast cancer.  BRCA-related cancer screening. This may be done if you have a family history of breast, ovarian, tubal, or peritoneal  cancers.  Pelvic exam and Pap test. This may be done every 3 years starting at age 46. Starting at age 108, this may be done every 5 years if you have a Pap test in combination with an HPV test. Other tests  Sexually transmitted disease (STD) testing.  Bone density scan. This is done to screen for osteoporosis. You may have this scan if you are at high risk for osteoporosis. Follow these instructions at home: Eating and drinking  Eat a diet that includes fresh fruits and vegetables, whole grains, lean protein, and low-fat dairy.  Take vitamin and mineral supplements as recommended by your health care provider.  Do not drink alcohol if: ? Your health care provider tells you not to drink. ? You are pregnant, may be pregnant, or are planning to become pregnant.  If you drink alcohol: ? Limit how much you have to 0-1 drink a day. ? Be aware of how much alcohol is in your drink. In the [place] one drink equals one 12 oz bottle of beer (355 mL), one 5 oz glass of wine (148 mL), or one 1 oz glass of hard liquor (44 mL). Lifestyle  Take daily care of your teeth and gums.  Stay active. Exercise for at least 30 minutes on 5 or more days each week.  Do not use any products that contain nicotine or tobacco, such as cigarettes, e-cigarettes, and chewing tobacco. If you need help quitting, ask your health care provider.  If you are sexually active, practice safe sex. Use a condom or other form of birth control (contraception) in order to prevent pregnancy and STIs (sexually transmitted infections).  If told by your health care provider, take low-dose aspirin daily starting at age 18. What's next?  Visit your health care provider once a year for a well check visit.  Ask your health care provider how often you should have your eyes and teeth checked.  Stay up to date on all vaccines. This information is not intended to replace advice given to you by your health care provider. Make sure you  discuss any questions you have with your health care provider. Document Released: 05/04/2015 Document Revised: 12/17/2017 Document Reviewed: 12/17/2017 Elsevier Patient Education  2020 [place] self-awareness is knowing how your breasts look and feel. Doing breast self-awareness is important. It allows you to catch a breast problem early while it is still small and can be treated. All women should do breast self-awareness, including women who have had breast implants. Tell your doctor if you notice a change in your breasts. What you need:  A mirror.  A well-lit room. How to do a breast self-exam A breast self-exam is one way to learn what is normal for your breasts and to check for changes. To do a breast self-exam: Look for changes  1. Take off all the clothes above your waist. 2. Stand in front of a mirror in a room with good lighting. 3. Put your hands on your hips. 4. Push your hands down. 5. Look at your breasts and nipples in the mirror to see if one breast or nipple looks  different from the other. Check to see if: ? The shape of one breast is different. ? The size of one breast is different. ? There are wrinkles, dips, and bumps in one breast and not the other. 6. Look at each breast for changes in the skin, such as: ? Redness. ? Scaly areas. 7. Look for changes in your nipples, such as: ? Liquid around the nipples. ? Bleeding. ? Dimpling. ? Redness. ? A change in where the nipples are. Feel for changes  1. Lie on your back on the floor. 2. Feel each breast. To do this, follow these steps: ? Pick a breast to feel. ? Put the arm closest to that breast above your head. ? Use your other arm to feel the nipple area of your breast. Feel the area with the pads of your three middle fingers by making small circles with your fingers. For the first circle, press lightly. For the second circle, press harder. For the third circle, press even  harder. ? Keep making circles with your fingers at the different pressures as you move down your breast. Stop when you feel your ribs. ? Move your fingers a little toward the center of your body. ? Start making circles with your fingers again, this time going up until you reach your collarbone. ? Keep making up-and-down circles until you reach your armpit. Remember to keep using the three pressures. ? Feel the other breast in the same way. 3. Sit or stand in the tub or shower. 4. With soapy water on your skin, feel each breast the same way you did in step 2 when you were lying on the floor. Write down what you find Writing down what you find can help you remember what to tell your doctor. Write down:  What is normal for each breast.  Any changes you find in each breast, including: ? The kind of changes you find. ? Whether you have pain. ? Size and location of any lumps.  When you last had your menstrual period. General tips  Check your breasts every month.  If you are breastfeeding, the best time to check your breasts is after you feed your baby or after you use a breast pump.  If you get menstrual periods, the best time to check your breasts is 5-7 days after your menstrual period is over.  With time, you will become comfortable with the self-exam, and you will begin to know if there are changes in your breasts. Contact a doctor if you:  See a change in the shape or size of your breasts or nipples.  See a change in the skin of your breast or nipples, such as red or scaly skin.  Have fluid coming from your nipples that is not normal.  Find a lump or thick area that was not there before.  Have pain in your breasts.  Have any concerns about your breast health. Summary  Breast self-awareness includes looking for changes in your breasts, as well as feeling for changes within your breasts.  Breast self-awareness should be done in front of a mirror in a well-lit room.  You  should check your breasts every month. If you get menstrual periods, the best time to check your breasts is 5-7 days after your menstrual period is over.  Let your doctor know of any changes you see in your breasts, including changes in size, changes on the skin, pain or tenderness, or fluid from your nipples that is not normal. This  information is not intended to replace advice given to you by your health care provider. Make sure you discuss any questions you have with your health care provider. Document Released: 09/24/2007 Document Revised: 11/24/2017 Document Reviewed: 11/24/2017 Elsevier Patient Education  [place].    Lichen Sclerosus Lichen sclerosus is a skin problem. It can happen on any part of the body, but it commonly involves the anal or genital areas. It can cause itching and discomfort in these areas. Treatment can help to control symptoms. When the genital area is affected, getting treatment is important because the condition can cause scarring that may lead to other problems. What are the causes? The cause of this condition is not known. It may be related to an overactive immune system or a lack of certain hormones. Lichen sclerosus is not an infection or a fungus, and it is not passed from one person to another (not contagious). What increases the risk? This condition is more likely to develop in women, usually after menopause. What are the signs or symptoms? Symptoms of this condition include:  Thin, wrinkled, white areas on the skin.  Thickened white areas on the skin.  Red and swollen patches (lesions) on the skin.  Tears or cracks in the skin.  Bruising.  Blood blisters.  Severe itching.  Pain, itching, or burning when urinating. Constipation is also common in people with lichen sclerosus. How is this diagnosed? This condition may be diagnosed with a physical exam. In some cases, a tissue sample (biopsy sample) may be removed to be looked at under a  microscope. How is this treated? This condition is usually treated with medicated creams or ointments (topical steroids) that are applied over the affected areas. In some cases, treatment may also include medicines that are taken by mouth. Surgery may be needed in more severe cases that are causing problems such as scarring. Follow these instructions at home:  Take or use over-the-counter and prescription medicines only as told by your health care provider.  Use creams or ointments as told by your health care provider.  Do not scratch the affected areas of skin.  If you are a woman, be sure to keep the vaginal area as clean and dry as possible.  Clean the affected area of skin gently with water. Avoid using rough towels or toilet paper.  Keep all follow-up visits as told by your health care provider. This is important. Contact a health care provider if:  You have increasing redness, swelling, or pain in the affected area.  You have fluid, blood, or pus coming from the affected area.  You have new lesions on your skin.  You have a fever.  You have pain during sex. Summary  Lichen sclerosus is a skin problem. When the genital area is affected, getting treatment is important because the condition can cause scarring that may lead to other problems.  This condition is usually treated with medicated creams or ointments (topical steroids) that are applied over the affected areas.  Take or use over-the-counter and prescription medicines only as told by your health care provider.  Contact a health care provider if you have new lesions on your skin, have pain during sex, or have increasing redness, swelling, or pain in the affected area.  Keep all follow-up visits as told by your health care provider. This is important. This information is not intended to replace advice given to you by your health care provider. Make sure you discuss any questions you have with  your health care  provider. Document Released: 08/28/2010 Document Revised: 08/20/2017 Document Reviewed: 08/20/2017 Elsevier Patient Education  2020 [place] After This sheet gives you information about how to care for yourself after your procedure. Your doctor may also give you more specific instructions. If you have problems or questions, contact your doctor. What can I expect after the procedure? After the procedure, it is common to have:  Slight bleeding from the biopsy site.  Soreness at the biopsy site. Follow these instructions at home: Biopsy site care   Follow instructions from your doctor about how to take care of your biopsy site. Make sure you: ? Clean the area using water and mild soap two times a day or as told by your doctor. Gently pat the area dry. ? If you were prescribed an antibiotic ointment, apply it as told by your doctor. Do not stop using the antibiotic even if your condition gets better. ? Take a warm water bath (sitz bath) as needed to help with pain. A sitz bath is taken while you are sitting down. The water should only come up to your hips and cover your butt. ? Leave stitches (sutures), skin glue, or skin tape (adhesive) strips in place. They may need to stay in place for 2 weeks or longer. If tape strips get loose and curl up, you may trim the loose edges. Do not remove tape strips completely unless your doctor says it is okay. ? Check your biopsy area every day for signs of infection. Check for:  More redness, swelling, or pain.  More fluid or blood.  Warmth.  Pus or a bad smell. ? Do not rub the biopsy area after peeing (urinating).  Gently pat the area dry, or use a bottle filled with warm water (peri-bottle) to clean the area.  Gently wipe from front to back. Lifestyle  Wear loose, cotton underwear.  Do not wear tight pants.  Do not use a tampon, douche, or put anything in your vagina for at least 1 week or until your doctor  says it is okay.  Do not have sex for at least 1 week or until your doctor says it is okay.  Do not exercise until your doctor says it is okay.  Do not swim or use a hot tub until your doctor says it is okay. You may shower or take a sitz bath. General instructions  Take over-the-counter and prescription medicines only as told by your doctor.  Use a sanitary pad until the bleeding stops.  Keep all follow-up visits as told by your doctor. This is important. Contact a doctor if:  You have more redness, swelling, or pain around your biopsy site.  You have more fluid or blood coming from your biopsy site.  Your biopsy site feels warm when you touch it.  Medicines do not help with your pain. Get help right away if:  You have a lot of bleeding from the vulva.  You have pus or a bad smell coming from your biopsy site.  You have a fever.  You have pain in the lower belly (abdomen). Summary  After the procedure, it is common to have slight bleeding and soreness at the biopsy site.  Follow all instructions as told by your doctor. Clean the area with water and mild soap. Do not rub. Pat the area dry.  Take sitz baths as needed. Leave any stitches in place.  Check your biopsy site for infection. Signs  include more redness, swelling, pain, fluid, or blood, or feeling warm when you touch it.  Get help right away if you have a lot of bleeding, a fever, pus or a bad smell, or pain in your lower belly. This information is not intended to replace advice given to you by your health care provider. Make sure you discuss any questions you have with your health care provider. Document Released: 07/04/2008 Document Revised: 10/08/2017 Document Reviewed: 10/08/2017 Elsevier Patient Education  2020 [employer].

## 2018-11-15 NOTE — Progress Notes (Signed)
Pt is present for annual exam. Pt's last pap 07/09/17 with neg results. Pt's last mammogram 11/10/18 with neg results. Pt stated that she has been doing self-breast exams monthly. Pt stated that having a rash/itching on the outside of the vaginal area x several months.

## 2018-11-16 ENCOUNTER — Other Ambulatory Visit: Payer: Self-pay

## 2018-11-16 ENCOUNTER — Ambulatory Visit (INDEPENDENT_AMBULATORY_CARE_PROVIDER_SITE_OTHER): Payer: PRIVATE HEALTH INSURANCE | Admitting: Obstetrics and Gynecology

## 2018-11-16 ENCOUNTER — Encounter: Payer: Self-pay | Admitting: Obstetrics and Gynecology

## 2018-11-16 ENCOUNTER — Other Ambulatory Visit (HOSPITAL_COMMUNITY)
Admission: RE | Admit: 2018-11-16 | Discharge: 2018-11-16 | Disposition: A | Discharge status: Home / Self Care | Payer: PRIVATE HEALTH INSURANCE | Source: Ambulatory Visit | Attending: Obstetrics and Gynecology | Admitting: Obstetrics and Gynecology

## 2018-11-16 VITALS — BP 120/76 | HR 80 | Ht 68.0 in | Wt 318.2 lb

## 2018-11-16 DIAGNOSIS — E785 Hyperlipidemia, unspecified: Secondary | ICD-10-CM | POA: Diagnosis not present

## 2018-11-16 DIAGNOSIS — Z01419 Encounter for gynecological examination (general) (routine) without abnormal findings: Secondary | ICD-10-CM | POA: Diagnosis not present

## 2018-11-16 DIAGNOSIS — I10 Essential (primary) hypertension: Secondary | ICD-10-CM

## 2018-11-16 DIAGNOSIS — L292 Pruritus vulvae: Secondary | ICD-10-CM | POA: Diagnosis not present

## 2018-11-16 DIAGNOSIS — D229 Melanocytic nevi, unspecified: Secondary | ICD-10-CM

## 2018-11-16 DIAGNOSIS — Z1231 Encounter for screening mammogram for malignant neoplasm of breast: Secondary | ICD-10-CM

## 2018-11-16 DIAGNOSIS — E039 Hypothyroidism, unspecified: Secondary | ICD-10-CM

## 2018-11-16 MED ORDER — CLOBETASOL PROPIONATE 0.05 % EX OINT: TOPICAL_OINTMENT | CUTANEOUS | 5 refills | Status: DC

## 2018-11-16 NOTE — Progress Notes (Signed)
ANNUAL PREVENTATIVE CARE GYNECOLOGY  ENCOUNTER NOTE  Subjective:       Christine Moody is a 58 y.o. G74P2002 female here for a routine annual gynecologic exam. She has a PMH of:   Past Medical History:  Diagnosis Date  . Depression   . Hypertension   . Menopause   . Obese   . Plantar fasciitis   . Thyroid disease   . Trigeminal neuralgia     PCP: Denton Lank, MD , gets her labs done there.   Exercising minimally right now, though she is riding her motorcycle and stops hourly to stretch. Eats fruits and vegetables when she cooks dinner, tries to limit unhealthy foods. She denies post menopausal bleeding. Has the patient ever been transfused or tattooed?: no. The patient reports that there is no domestic violence in her life.  She notes seasonal allergies, well controlled on OTC claritin.   She has the following complaints today: 1. Mild intermittent vulvar itching near mons pubis, the same as last year. Sometimes sees little spots of blood after washing. Much worse in the warmer months. She keeps the area clean and dry, and uses pantiliner. Hasn't tried anything else for the itching. She notes scratching the area, but denies rash/ulceration.    Gynecologic History No LMP recorded. Patient has had a hysterectomy.  Menarche age: 83 Contraception: status post hysterectomy History of STI's: Denies Last Pap: 07/09/2017. Results were: normal.  Denies h/o abnormal pap smears. H/o hysterectomy, but she retains cervix.  Last mammogram: 11/10/2018. Results were: normal Last colonoscopy: 07/2017. Results were: sigmoid colon polyps (x 2), and perianal skin tags. Recommend repeat in 5 years (07/2022).   Obstetric History OB History  Gravida Para Term Preterm AB Living  2 2 2         SAB TAB Ectopic Multiple Live Births               # Outcome Date GA Lbr Len/2nd Weight Sex Delivery Anes PTL Lv  2 Term 57    F Vag-Spont     1 Term 1983    F Vag-Spont       Past Medical History:   Diagnosis Date  . Depression   . Hypertension   . Menopause   . Obese   . Plantar fasciitis   . Thyroid disease   . Trigeminal neuralgia     Family History  Problem Relation Age of Onset  . Breast cancer Mother 39  . Coronary artery disease Father   . Cancer Father        prostate  . Heart disease Father   . Gout Father     Past Surgical History:  Procedure Laterality Date  . ABDOMINAL HYSTERECTOMY     Retains cervix. [place]- DR [place]-D/T- HEAVY MENSES  . CHOLECYSTECTOMY    . COLONOSCOPY WITH PROPOFOL N/A 08/10/2017   Procedure: COLONOSCOPY WITH PROPOFOL;  Surgeon: Lin Landsman, MD;  Location: [facility] ENDOSCOPY;  Service: Gastroenterology;  Laterality: N/A;  . FACIAL SIALOCELE MARSUPILIZATION      Social History   Socioeconomic History  . Marital status: Married    Spouse name: Not on file  . Number of children: Not on file  . Years of education: Not on file  . Highest education level: Not on file  Occupational History  . Not on file  Social Needs  . Financial resource strain: Not on file  . Food insecurity    Worry: Not on file    Inability: Not  on file  . Transportation needs    Medical: Not on file    Non-medical: Not on file  Tobacco Use  . Smoking status: Former Smoker    Quit date: 04/21/1990    Years since quitting: 28.5  . Smokeless tobacco: Never Used  Substance and Sexual Activity  . Alcohol use: No  . Drug use: No  . Sexual activity: Not Currently    Birth control/protection: Surgical    Comment: HUSBAND VASECTOMY  Lifestyle  . Physical activity    Days per week: Not on file    Minutes per session: Not on file  . Stress: Not on file  Relationships  . Social Herbalist on phone: Not on file    Gets together: Not on file    Attends religious service: Not on file    Active member of club or organization: Not on file    Attends meetings of clubs or organizations: Not on file    Relationship status: Not on file  . Intimate  partner violence    Fear of current or ex partner: Not on file    Emotionally abused: Not on file    Physically abused: Not on file    Forced sexual activity: Not on file  Other Topics Concern  . Not on file  Social History Narrative  . Not on file    Current Outpatient Medications on File Prior to Visit  Medication Sig Dispense Refill  . aspirin EC 81 MG tablet Take 81 mg by mouth daily.    . citalopram (CELEXA) 40 MG tablet Take by mouth.    . levothyroxine (SYNTHROID, LEVOTHROID) 125 MCG tablet Take 125 mcg by mouth daily before breakfast.    . lisinopril (PRINIVIL,ZESTRIL) 10 MG tablet Take 10 mg by mouth daily.    . vitamin B-12 (CYANOCOBALAMIN) 1000 MCG tablet Take by mouth.     No current facility-administered medications on file prior to visit.     Allergies  Allergen Reactions  . Penicillins Rash and Hives     Review of Systems ROS Review of Systems - General ROS: negative for - chills, fatigue, fever, hot flashes, night sweats, weight gain or weight loss Psychological ROS: negative for - anxiety, decreased libido, depression, mood swings, physical abuse or sexual abuse Ophthalmic ROS: negative for - blurry vision, eye pain or loss of vision ENT ROS: negative for - headaches, hearing change, visual changes or vocal changes Allergy and Immunology ROS: negative for - hives, itchy/watery eyes or seasonal allergies Hematological and Lymphatic ROS: negative for - bleeding problems, bruising, swollen lymph nodes or weight loss Endocrine ROS: negative for - galactorrhea, hair pattern changes, hot flashes, malaise/lethargy, mood swings, palpitations, polydipsia/polyuria, skin changes, temperature intolerance or unexpected weight changes Breast ROS: negative for - new or changing breast lumps or nipple discharge Respiratory ROS: negative for - cough or shortness of breath Cardiovascular ROS: negative for - chest pain, irregular heartbeat, palpitations or shortness of breath  Gastrointestinal ROS: no abdominal pain, change in bowel habits, or black or bloody stools Genito-Urinary ROS: no dysuria, trouble voiding, or hematuria. vulvar itching near mons pubis (see HPI). Musculoskeletal ROS: negative for - joint pain or joint stiffness Neurological ROS: negative for - bowel and bladder control changes Dermatological ROS: negative for rash and skin lesion changes   Objective:  Blood pressure 120/76, pulse 80, height 5\' 8"  (1.727 m), weight (!) 318 lb 3.2 oz (144.3 kg).  Body mass index is 48.38 kg/m.  General Appearance:    Alert, cooperative, no distress, appears stated age  Head:    Normocephalic, without obvious abnormality, atraumatic  HEENT:   grossly normal  Lungs:     Clear to auscultation bilaterally, respirations unlabored   Heart:    Regular rate and rhythm, S1 and S2 normal, no murmur, rub or gallop  Breast Exam:    No tenderness, masses, or nipple abnormality. Several moles under right breast  Abdomen:     Soft, non-tender, bowel sounds active all four quadrants, no masses, no organomegaly.    Genitalia:    Pelvic:external genitalia with what appears to be lichenification or atrophy of vulva (labia majora bilaterally) with few excoriations. Vagina without lesions, discharge, or tenderness, rectovaginal septum  normal. Cervix normal in appearance, no cervical motion tenderness, no adnexal masses or tenderness.  Uterus normal size, shape, mobile, regular contours, nontender.  Rectal:    Normal external sphincter.  No hemorrhoids appreciated. Internal exam not done.   Extremities:   Extremities normal, atraumatic, no cyanosis or edema  Skin:  Several moles along abdomen and trunk, also below right breast.       Labs: Lab Results  Component Value Date   WBC 6.3 07/09/2017   HGB 15.5 07/09/2017   HCT 45.0 07/09/2017   MCV 92 07/09/2017   PLT 223 07/09/2017    Lab Results  Component Value Date   CREATININE 0.83 07/09/2017   BUN 11 07/09/2017   NA  141 07/09/2017   K 5.1 07/09/2017   CL 101 07/09/2017   CO2 25 07/09/2017    Lab Results  Component Value Date   ALT 22 07/09/2017   AST 21 07/09/2017   ALKPHOS 59 07/09/2017   BILITOT 0.6 07/09/2017    Lab Results  Component Value Date   CHOL 228 (H) 07/09/2017   HDL 61 07/09/2017   LDLCALC 138 (H) 07/09/2017   TRIG 146 07/09/2017   CHOLHDL 3.7 07/09/2017    Lab Results  Component Value Date   TSH 2.400 07/09/2017    No results found for: HGBA1C   Assessment & Plan   Annual gynecologic examination 58 y.o.  Problem List Items Addressed This Visit    None    Visit Diagnoses    Women's annual routine gynecological examination    -  Primary   Vulvar itching       Relevant Orders   Surgical pathology   Breast cancer screening by mammogram       Relevant Orders   MM 3D SCREEN BREAST BILATERAL   Dyslipidemia       Numerous skin moles       Acquired hypothyroidism       Morbid obesity ([place])       Essential hypertension          Blood tests: continue to follow with PCP. Breast self exam technique reviewed and patient encouraged to perform self-exam monthly. Mammogram up to date. Continue yearly routine screening.   Contraception: status post hysterectomy, retains cervix. Discussed healthy lifestyle modifications. Mammogram UTD, normal. Colonoscopy UTD (07/2017), repeat in 5 years (07/2022). Pap smear UTD, normal. Patient still retains cervix after hysterectomy. Continue routine screening.  For persistent vulvar itching, possible lichenification vs atrophy of the vulva noted. Biopsy performed today. (See biopsy note below).  Prescription for clobeaso sent. WIll f/u with pathology.  Recommend patient toestablish with a Dermatologist for moles.  Multiple comorbidities (HTN, hypothyroidism, depression)-- managed by PCP.   Return to Clinic - 1  Year or as needed   VULVAR BIOPSY NOTE The indications for vulvar biopsy (rule out neoplasia, establish lichen sclerosus  diagnosis) were reviewed.   Risks of the biopsy including pain, bleeding, infection, inadequate specimen, scarring and need for additional procedures  were discussed. The patient stated understanding and agreed to undergo procedure today. Consent was signed,  time out performed.  The patient's vulva was prepped with Betadine. 1% lidocaine was injected into a small area of the left labia majora. A 3.5-mm punch biopsy was done, biopsy tissue was picked up with sterile forceps and sterile scissors were used to excise the lesion.  Small bleeding was noted and hemostasis was achieved using silver nitrate sticks.  The patient tolerated the procedure well. Post-procedure instructions  (pelvic rest for one week) were given to the patient. The patient is to call with heavy bleeding, fever greater than 100.4, foul smelling vaginal discharge or other concerns. Will contact patient and notify by phone of results.   Marin Roberts, PA-S 11/16/18     I have seen and examined the patient with Marin Roberts, [place] PA-S.  I have reviewed/edited the record and concur with patient management and plan as documented.   Rubie Maid, MD [facility]

## 2018-12-16 ENCOUNTER — Other Ambulatory Visit: Payer: Self-pay

## 2018-12-16 ENCOUNTER — Ambulatory Visit: Payer: PRIVATE HEALTH INSURANCE | Admitting: Obstetrics and Gynecology

## 2018-12-16 ENCOUNTER — Encounter: Payer: Self-pay | Admitting: Obstetrics and Gynecology

## 2018-12-16 VITALS — BP 122/77 | HR 71 | Ht 68.0 in | Wt 314.5 lb

## 2018-12-16 DIAGNOSIS — L304 Erythema intertrigo: Secondary | ICD-10-CM | POA: Diagnosis not present

## 2018-12-16 DIAGNOSIS — N904 Leukoplakia of vulva: Secondary | ICD-10-CM | POA: Insufficient documentation

## 2018-12-16 NOTE — Progress Notes (Signed)
    GYNECOLOGY PROGRESS NOTE  Subjective:    Patient ID: Christine Moody, female    DOB: 08-05-1960, 58 y.o.   MRN: [id number]  HPI  Patient is a 58 y.o. G55P2000 female who presents for 1 month follow up of vulvar itching. Patient was treated with Clobetasol cream after biopsy confirmed presence of lichen sclerosis (as well as psoriaform changes and irritant dermatitis). Patient notes that since she has initiated use of the cream she has felt significant relief from her itching. Is overall doing well, using medication as prescribed.    The following portions of the patient's history were reviewed and updated as appropriate: allergies, current medications, past family history, past medical history, past social history, past surgical history and problem list.  Review of Systems Pertinent items noted in HPI and remainder of comprehensive ROS otherwise negative.   Objective:   Blood pressure 122/77, pulse 71, height 5\' 8"  (1.727 m), weight (!) 314 lb 8 oz (142.7 kg). General appearance: alert and no distress Pelvic: external genitalia with mild lichenification (improved since last visit) of the vulva (labia majora bilaterally).Internal exam not performed.   Skin: Bilateral thighs with small patches of what appears to be contact dermatitis (perhaps secondary to friction rub/chafing).    Assessment:   Lichen sclerosus of female genitalia Chafing  Plan:   1. Patient advised to continue use of Clobetasol cream for total of 16 weeks. After this time, will re-evaluate. Patient to f/u in 3 months for reassessment.  2. Chafing, patient notes she noticed the areas on her legs yesterday. Discussed home remedies to help with chafing. Notes that she has also changed to unscented detergents recently.    Rubie Maid, MD [facility]

## 2018-12-16 NOTE — Progress Notes (Signed)
Pt is present for follow up on vaginal issues and medication review. Pt stated that she is doing a lot better.

## 2018-12-16 NOTE — Patient Instructions (Signed)
Lichen Sclerosus Lichen sclerosus is a skin problem. It can happen on any part of the body. It happens most often in the anal or genital areas. It can cause itching and discomfort. Treatment can help to control symptoms. It can also help prevent scarring that may lead to other problems. What are the causes? The cause of this condition is not known. It is not passed from one person to another (not contagious). What increases the risk? This condition is more likely to develop in women. It most often occurs after menopause. What are the signs or symptoms? Symptoms of this condition include:  Thin, wrinkled, white areas on the skin.  Thickened white areas on the skin.  Red and swollen patches (lesions) on the skin.  Tears or cracks in the skin.  Bruising.  Blood blisters.  Very bad itching.  Pain, itching, or burning when peeing (urinating).  Trouble pooping (constipation). How is this diagnosed? This condition may be diagnosed with a physical exam. A sample of your skin may also be removed to be looked at under a microscope (biopsy). How is this treated? This condition may be treated with:  Creams or ointments (topical steroids) that are put on the skin in the affected areas. This is the most common treatment.  Medicines that are taken by mouth.  Surgery. This is only needed if the condition is very bad and is causing problems such as scarring. Follow these instructions at home:  Take or use over-the-counter and prescription medicines only as told by your doctor.  Use creams or ointments as told by your doctor.  Do not scratch the affected areas of skin.  If you are a woman, keep the vagina as clean and dry as you can.  Clean the affected area of skin gently with water. Avoid using rough towels or toilet paper.  Keep all follow-up visits as told by your doctor. This is important. Contact a doctor if:  Your redness, swelling, or pain gets worse.  You have fluid,  blood, or pus coming from the area.  You have new red and swollen patches on your skin.  You have a fever.  You have pain during sex. Summary  Lichen sclerosus is a skin problem. It can cause itching and discomfort.  This condition is usually treated with creams or ointments that are put on the skin in the affected areas.  Use medicines only as told by your doctor.  Do not scratch the affected areas of skin.  Keep all follow-up visits as told by your doctor. This is important. This information is not intended to replace advice given to you by your health care provider. Make sure you discuss any questions you have with your health care provider. Document Released: 03/20/2008 Document Revised: 08/20/2017 Document Reviewed: 08/20/2017 Elsevier Patient Education  2020 Elsevier Inc.  

## 2018-12-21 DIAGNOSIS — Z8616 Personal history of COVID-19: Secondary | ICD-10-CM

## 2018-12-21 DIAGNOSIS — Z8619 Personal history of other infectious and parasitic diseases: Secondary | ICD-10-CM

## 2018-12-21 HISTORY — DX: Personal history of other infectious and parasitic diseases: Z86.19

## 2018-12-21 HISTORY — DX: Personal history of COVID-19: Z86.16

## 2019-01-19 DIAGNOSIS — Z8616 Personal history of COVID-19: Secondary | ICD-10-CM | POA: Insufficient documentation

## 2019-01-19 DIAGNOSIS — Z8619 Personal history of other infectious and parasitic diseases: Secondary | ICD-10-CM | POA: Insufficient documentation

## 2019-03-22 ENCOUNTER — Other Ambulatory Visit: Payer: Self-pay

## 2019-03-22 ENCOUNTER — Ambulatory Visit: Payer: PRIVATE HEALTH INSURANCE | Admitting: Obstetrics and Gynecology

## 2019-03-22 ENCOUNTER — Encounter: Payer: Self-pay | Admitting: Obstetrics and Gynecology

## 2019-03-22 VITALS — BP 122/74 | HR 80 | Ht 68.0 in | Wt 321.2 lb

## 2019-03-22 DIAGNOSIS — N904 Leukoplakia of vulva: Secondary | ICD-10-CM | POA: Diagnosis not present

## 2019-03-22 DIAGNOSIS — Z8619 Personal history of other infectious and parasitic diseases: Secondary | ICD-10-CM

## 2019-03-22 DIAGNOSIS — Z8669 Personal history of other diseases of the nervous system and sense organs: Secondary | ICD-10-CM

## 2019-03-22 DIAGNOSIS — Z8616 Personal history of COVID-19: Secondary | ICD-10-CM

## 2019-03-22 NOTE — Patient Instructions (Signed)
Lichen Sclerosus Lichen sclerosus is a skin problem. It can happen on any part of the body. It happens most often in the anal or genital areas. It can cause itching and discomfort. Treatment can help to control symptoms. It can also help prevent scarring that may lead to other problems. What are the causes? The cause of this condition is not known. It is not passed from one person to another (not contagious). What increases the risk? This condition is more likely to develop in women. It most often occurs after menopause. What are the signs or symptoms? Symptoms of this condition include:  Thin, wrinkled, white areas on the skin.  Thickened white areas on the skin.  Red and swollen patches (lesions) on the skin.  Tears or cracks in the skin.  Bruising.  Blood blisters.  Very bad itching.  Pain, itching, or burning when peeing (urinating).  Trouble pooping (constipation). How is this diagnosed? This condition may be diagnosed with a physical exam. A sample of your skin may also be removed to be looked at under a microscope (biopsy). How is this treated? This condition may be treated with:  Creams or ointments (topical steroids) that are put on the skin in the affected areas. This is the most common treatment.  Medicines that are taken by mouth.  Surgery. This is only needed if the condition is very bad and is causing problems such as scarring. Follow these instructions at home:  Take or use over-the-counter and prescription medicines only as told by your doctor.  Use creams or ointments as told by your doctor.  Do not scratch the affected areas of skin.  If you are a woman, keep the vagina as clean and dry as you can.  Clean the affected area of skin gently with water. Avoid using rough towels or toilet paper.  Keep all follow-up visits as told by your doctor. This is important. Contact a doctor if:  Your redness, swelling, or pain gets worse.  You have fluid,  blood, or pus coming from the area.  You have new red and swollen patches on your skin.  You have a fever.  You have pain during sex. Summary  Lichen sclerosus is a skin problem. It can cause itching and discomfort.  This condition is usually treated with creams or ointments that are put on the skin in the affected areas.  Use medicines only as told by your doctor.  Do not scratch the affected areas of skin.  Keep all follow-up visits as told by your doctor. This is important. This information is not intended to replace advice given to you by your health care provider. Make sure you discuss any questions you have with your health care provider. Document Released: 03/20/2008 Document Revised: 08/20/2017 Document Reviewed: 08/20/2017 Elsevier Patient Education  2020 [employer].

## 2019-03-22 NOTE — Progress Notes (Signed)
    GYNECOLOGY PROGRESS NOTE  Subjective:    Patient ID: Christine Moody, female    DOB: 1960/06/11, 58 y.o.   MRN: [id number]  HPI  Patient is a 58 y.o. G21P2002 female who presents for 4 month follow up of psoriaform changes and lichen sclerosis. Patient was treated with Clobetasol cream after biopsy confirmed presence of lichen sclerosis (as well as psoriaform changes and irritant dermatitis). Patient reports complete resolution of her symptoms at this time. Has been using her Clobetasol as prescribed.    The following portions of the patient's history were reviewed and updated as appropriate: allergies, current medications, past family history, past medical history, past social history, past surgical history and problem list.  Review of Systems A comprehensive review of systems was negative except for: Ears, nose, mouth, throat, and face: positive for tingling in ear  Reports testing for COVID in September and also had a double ear infection in the left ear.  Desires for someone to check her ear to make sure it's gone.   Objective:   Blood pressure 122/74, pulse 80, height 5\' 8"  (1.727 m), weight (!) 321 lb 3.2 oz (145.7 kg). General appearance: alert and no distress. Ears: normal TM and external ear canal left ear, mild excoriations noted in ear canal, small amount of fluid behind tympanic membrane.  Pelvic: external genitalia appears normal. Lichen sclerosis significantly improved. No erythema. Internal exam not performed.   Skin: Bilateral thighs with moderate patches of scaling skin/chafing.   Assessment:   Lichen sclerosus of female genitalia History of COVID infection History of ear infection  Plan:   1. Patient can Clobetasol cream for total of 16 weeks. After this time, will re-evaluate. Patient to f/u in 6 months for reassessment.  2. History of COVID infection, updated in medical chart. No symptoms.  3. Chafing, patient notes she noticed the areas on her legs yesterday.  Discussed home remedies to help with chafing. Notes that she has also changed to unscented detergents recently.  4. Normal ear, no evidence of infection.    Rubie Maid, MD [facility]

## 2019-03-22 NOTE — Progress Notes (Signed)
Pt stated that she was doing a lot better. Pt stated that she is not having any more itching in the area.

## 2019-07-17 ENCOUNTER — Ambulatory Visit: Payer: PRIVATE HEALTH INSURANCE | Attending: Internal Medicine

## 2019-07-17 DIAGNOSIS — Z23 Encounter for immunization: Secondary | ICD-10-CM

## 2019-07-17 NOTE — Progress Notes (Signed)
   Covid-19 Vaccination Clinic  Name:  Christine Moody    MRN: [id number] DOB: 03-20-1961  07/17/2019  [name] was observed post Covid-19 immunization for 15 minutes without incident. She was provided with Vaccine Information Sheet and instruction to access the V-Safe system.   [name] was instructed to call 911 with any severe reactions post vaccine: Marland Kitchen Difficulty breathing  . Swelling of face and throat  . A fast heartbeat  . A bad rash all over body  . Dizziness and weakness   Immunizations Administered    Name Date Dose VIS Date Route   Pfizer COVID-19 Vaccine 07/17/2019  4:26 PM 0.3 mL 04/01/2019 Intramuscular   Manufacturer: [place]   Lot: [id number]   [place]: [id number]

## 2019-08-07 ENCOUNTER — Ambulatory Visit: Payer: PRIVATE HEALTH INSURANCE | Attending: Internal Medicine

## 2019-08-07 DIAGNOSIS — Z23 Encounter for immunization: Secondary | ICD-10-CM

## 2019-08-07 NOTE — Progress Notes (Signed)
   Covid-19 Vaccination Clinic  Name:  Christine Moody    MRN: [id number] DOB: [birth year]  08/07/2019  Christine Moody was observed post Covid-19 immunization for 15 minutes without incident. She was provided with Vaccine Information Sheet and instruction to access the V-Safe system.   Christine Moody was instructed to call 911 with any severe reactions post vaccine: Marland Kitchen Difficulty breathing  . Swelling of face and throat  . A fast heartbeat  . A bad rash all over body  . Dizziness and weakness   Immunizations Administered    Name Date Dose VIS Date Route   Pfizer COVID-19 Vaccine 08/07/2019  4:12 PM 0.3 mL 04/01/2019 Intramuscular   Manufacturer: [place]   Lot: [id number]   [place]: [id number]

## 2019-09-20 NOTE — Progress Notes (Signed)
Pt present for follow up for lichen sclerosis. Pt stated that she has noticed improvement in her symptoms.

## 2019-09-21 ENCOUNTER — Ambulatory Visit: Payer: PRIVATE HEALTH INSURANCE | Admitting: Obstetrics and Gynecology

## 2019-09-21 ENCOUNTER — Encounter: Payer: Self-pay | Admitting: Obstetrics and Gynecology

## 2019-09-21 ENCOUNTER — Other Ambulatory Visit: Payer: Self-pay

## 2019-09-21 VITALS — BP 125/76 | HR 72 | Ht 68.0 in | Wt 322.4 lb

## 2019-09-21 DIAGNOSIS — R600 Localized edema: Secondary | ICD-10-CM | POA: Diagnosis not present

## 2019-09-21 DIAGNOSIS — N904 Leukoplakia of vulva: Secondary | ICD-10-CM | POA: Diagnosis not present

## 2019-09-21 NOTE — Patient Instructions (Signed)
Edema  Edema is when you have too much fluid in your body or under your skin. Edema may make your legs, feet, and ankles swell up. Swelling is also common in looser tissues, like around your eyes. This is a common condition. It gets more common as you get older. There are many possible causes of edema. Eating too much salt (sodium) and being on your feet or sitting for a long time can cause edema in your legs, feet, and ankles. Hot weather may make edema worse. Edema is usually painless. Your skin may look swollen or shiny. Follow these instructions at home:  Keep the swollen body part raised (elevated) above the level of your heart when you are sitting or lying down.  Do not sit still or stand for a long time.  Do not wear tight clothes. Do not wear garters on your upper legs.  Exercise your legs. This can help the swelling go down.  Wear elastic bandages or support stockings as told by your doctor.  Eat a low-salt (low-sodium) diet to reduce fluid as told by your doctor.  Depending on the cause of your swelling, you may need to limit how much fluid you drink (fluid restriction).  Take over-the-counter and prescription medicines only as told by your doctor. Contact a doctor if:  Treatment is not working.  You have heart, liver, or kidney disease and have symptoms of edema.  You have sudden and unexplained weight gain. Get help right away if:  You have shortness of breath or chest pain.  You cannot breathe when you lie down.  You have pain, redness, or warmth in the swollen areas.  You have heart, liver, or kidney disease and get edema all of a sudden.  You have a fever and your symptoms get worse all of a sudden. Summary  Edema is when you have too much fluid in your body or under your skin.  Edema may make your legs, feet, and ankles swell up. Swelling is also common in looser tissues, like around your eyes.  Raise (elevate) the swollen body part above the level of your  heart when you are sitting or lying down.  Follow your doctor's instructions about diet and how much fluid you can drink (fluid restriction). This information is not intended to replace advice given to you by your health care provider. Make sure you discuss any questions you have with your health care provider. Document Revised: 04/10/2017 Document Reviewed: 04/25/2016 Elsevier Patient Education  2020 Elsevier Inc.  

## 2019-09-21 NOTE — Progress Notes (Signed)
    GYNECOLOGY PROGRESS NOTE  Subjective:    Patient ID: Christine Moody, female    DOB: 07/24/1960, 59 y.o.   MRN: [id number]  HPI  Patient is a 59 y.o. G4P2000 female who presents for 6 month f/u of lichen sclerosis.  Notes she has been doing well, until 2 days ago when she began experiencing symptoms.  Has resumed use of Clobetasol cream and is doing better.   She also complains of swelling in her legs over the past week. Notes she increased her hydration due to increase in outdoor temperatures but feels like this led to her swelling. Denies any pain.    The following portions of the patient's history were reviewed and updated as appropriate: allergies, current medications, past family history, past medical history, past social history, past surgical history and problem list.  Review of Systems Pertinent items noted in HPI and remainder of comprehensive ROS otherwise negative.   Objective:   Blood pressure 125/76, pulse 72, height 5\' 8"  (1.727 m), weight (!) 322 lb 6.4 oz (146.2 kg). General appearance: alert and no distress Pelvic: normal external genitalia, no lesions.  Absence of lichenification of tissues at this time. Internal exam not performed.  Extremities: edema +2 pitting and no cyanosis or redness or tenderness    Assessment:   1. Lichen sclerosus of female genitalia   2. Bilateral lower extremity edema     Plan:   1. Continue use of Clobetasol as needed for lichen sclerosis flares.  Otherwise doing well.  2. Discussed use of compression stockings, limiting salt intake. Patient inquires into OTC diuretics or if she needs a prescription. Advised that she can try OTC first if she desires. To use occasionally as needed.  3. RTC in 6 months for follow up of lichen sclerosis. Has annual exam next month.   Rubie Maid, MD [facility] 09/21/2019 2:05 PM

## 2019-11-17 ENCOUNTER — Encounter: Payer: PRIVATE HEALTH INSURANCE | Admitting: Obstetrics and Gynecology

## 2019-11-18 ENCOUNTER — Ambulatory Visit (INDEPENDENT_AMBULATORY_CARE_PROVIDER_SITE_OTHER): Payer: PRIVATE HEALTH INSURANCE | Admitting: Obstetrics and Gynecology

## 2019-11-18 ENCOUNTER — Encounter: Payer: Self-pay | Admitting: Obstetrics and Gynecology

## 2019-11-18 VITALS — BP 122/75 | HR 67 | Ht 68.0 in | Wt 321.9 lb

## 2019-11-18 DIAGNOSIS — N904 Leukoplakia of vulva: Secondary | ICD-10-CM

## 2019-11-18 DIAGNOSIS — I10 Essential (primary) hypertension: Secondary | ICD-10-CM

## 2019-11-18 DIAGNOSIS — E039 Hypothyroidism, unspecified: Secondary | ICD-10-CM

## 2019-11-18 DIAGNOSIS — Z01419 Encounter for gynecological examination (general) (routine) without abnormal findings: Secondary | ICD-10-CM | POA: Diagnosis not present

## 2019-11-18 DIAGNOSIS — Z1231 Encounter for screening mammogram for malignant neoplasm of breast: Secondary | ICD-10-CM | POA: Diagnosis not present

## 2019-11-18 DIAGNOSIS — E785 Hyperlipidemia, unspecified: Secondary | ICD-10-CM

## 2019-11-18 MED ORDER — CLOBETASOL PROPIONATE 0.05 % EX OINT: TOPICAL_OINTMENT | Freq: Every evening | CUTANEOUS | 2 refills | Status: AC | PRN

## 2019-11-18 NOTE — Progress Notes (Signed)
ANNUAL PREVENTATIVE CARE GYNECOLOGY  ENCOUNTER NOTE  Subjective:       Christine Moody is a 59 y.o. G6P2002 female here for a routine annual gynecologic exam. She is overall doing well today and has no concerns. She has a PMH of:   Past Medical History:  Diagnosis Date  . Depression   . History of 2019 novel coronavirus disease (COVID-19) 12/21/2018  . Hypertension   . Menopause   . Obese   . Plantar fasciitis   . Thyroid disease   . Trigeminal neuralgia     PCP: Denton Lank, MD , gets her labs done there.   Exercising minimally right now, though she is riding her motorcycle and stops hourly to stretch.  Eats fruits and vegetables when she cooks dinner, tries to limit unhealthy foods. She feels the issue with her weight is due to portion control.  She denies post menopausal bleeding. Has the patient ever been transfused or tattooed?: no. The patient reports that there is no domestic violence in her life.    She has the following complaints today: 1. None   Gynecologic History No LMP recorded. Patient has had a hysterectomy.  Menarche age: 25 Contraception: status post hysterectomy History of STI's: Denies Last Pap: 07/09/2017. Results were: normal.  Denies h/o abnormal pap smears. H/o hysterectomy, but she retains cervix.  Last mammogram: 11/10/2018. Results were: normal Last colonoscopy: 07/2017. Results were: sigmoid colon polyps (x 2), and perianal skin tags. Recommend repeat in 5 years (07/2022).   Obstetric History OB History  Gravida Para Term Preterm AB Living  2 2 2         SAB TAB Ectopic Multiple Live Births               # Outcome Date GA Lbr Len/2nd Weight Sex Delivery Anes PTL Lv  2 Term 59    F Vag-Spont     1 Term 1983    F Vag-Spont       Past Medical History:  Diagnosis Date  . Depression   . History of 2019 novel coronavirus disease (COVID-19) 12/21/2018  . Hypertension   . Menopause   . Obese   . Plantar fasciitis   . Thyroid disease   .  Trigeminal neuralgia     Family History  Problem Relation Age of Onset  . Breast cancer Mother 71  . Coronary artery disease Father   . Cancer Father        prostate  . Heart disease Father   . Gout Father     Past Surgical History:  Procedure Laterality Date  . ABDOMINAL HYSTERECTOMY     Retains cervix. [place]- DR [place]-D/T- HEAVY MENSES  . CHOLECYSTECTOMY    . COLONOSCOPY WITH PROPOFOL N/A 08/10/2017   Procedure: COLONOSCOPY WITH PROPOFOL;  Surgeon: Lin Landsman, MD;  Location: [facility] ENDOSCOPY;  Service: Gastroenterology;  Laterality: N/A;  . FACIAL SIALOCELE MARSUPILIZATION      Social History   Socioeconomic History  . Marital status: Married    Spouse name: Not on file  . Number of children: Not on file  . Years of education: Not on file  . Highest education level: Not on file  Occupational History  . Not on file  Tobacco Use  . Smoking status: Former Smoker    Quit date: 04/21/1990    Years since quitting: 29.5  . Smokeless tobacco: Never Used  Vaping Use  . Vaping Use: Never used  Substance and Sexual Activity  .  Alcohol use: No  . Drug use: No  . Sexual activity: Not Currently    Birth control/protection: Surgical    Comment: HUSBAND VASECTOMY  Other Topics Concern  . Not on file  Social History Narrative  . Not on file   Social Determinants of Health   Financial Resource Strain:   . Difficulty of Paying Living Expenses:   Food Insecurity:   . Worried About Charity fundraiser in the Last Year:   . Arboriculturist in the Last Year:   Transportation Needs:   . Film/video editor (Medical):   Marland Kitchen Lack of Transportation (Non-Medical):   Physical Activity:   . Days of Exercise per Week:   . Minutes of Exercise per Session:   Stress:   . Feeling of Stress :   Social Connections:   . Frequency of Communication with Friends and Family:   . Frequency of Social Gatherings with Friends and Family:   . Attends Religious Services:   . Active  Member of Clubs or Organizations:   . Attends Archivist Meetings:   Marland Kitchen Marital Status:   Intimate Partner Violence:   . Fear of Current or Ex-Partner:   . Emotionally Abused:   Marland Kitchen Physically Abused:   . Sexually Abused:     Current Outpatient Medications on File Prior to Visit  Medication Sig Dispense Refill  . aspirin EC 81 MG tablet Take 81 mg by mouth daily.    . carbamazepine (TEGRETOL) 200 MG tablet Take 100 mg by mouth 3 (three) times daily.    . citalopram (CELEXA) 40 MG tablet Take by mouth.    . clobetasol ointment (TEMOVATE) 0.05 % Apply to affected area every night for 4 weeks, then every other day for 4 weeks and then twice a week for 4 weeks or until resolution. (Patient taking differently: at bedtime as needed. ) 30 g 5  . Cyanocobalamin (HM VITAMIN B-12 ULTRA STRENGTH) 5000 MCG TBDP Take by mouth.    . levothyroxine (SYNTHROID, LEVOTHROID) 125 MCG tablet Take 125 mcg by mouth daily before breakfast.    . lisinopril (PRINIVIL,ZESTRIL) 10 MG tablet Take 10 mg by mouth daily.    Marland Kitchen gabapentin (NEURONTIN) 300 MG capsule Take 300 mg by mouth at bedtime. (Patient not taking: Reported on 11/18/2019)     No current facility-administered medications on file prior to visit.    Allergies  Allergen Reactions  . Penicillins Rash and Hives     Review of Systems ROS Review of Systems - General ROS: negative for - chills, fatigue, fever, hot flashes, night sweats, weight gain or weight loss Psychological ROS: negative for - anxiety, decreased libido, depression, mood swings, physical abuse or sexual abuse Ophthalmic ROS: negative for - blurry vision, eye pain or loss of vision ENT ROS: negative for - headaches, hearing change, visual changes or vocal changes Allergy and Immunology ROS: negative for - hives, itchy/watery eyes or seasonal allergies Hematological and Lymphatic ROS: negative for - bleeding problems, bruising, swollen lymph nodes or weight loss Endocrine ROS:  negative for - galactorrhea, hair pattern changes, hot flashes, malaise/lethargy, mood swings, palpitations, polydipsia/polyuria, skin changes, temperature intolerance or unexpected weight changes Breast ROS: negative for - new or changing breast lumps or nipple discharge Respiratory ROS: negative for - cough or shortness of breath Cardiovascular ROS: negative for - chest pain, irregular heartbeat, palpitations or shortness of breath Gastrointestinal ROS: no abdominal pain, change in bowel habits, or black or bloody stools Genito-Urinary  ROS: no dysuria, trouble voiding, or hematuria. Intermittent vulvar itching (has h/o lichens sclerosis).  Musculoskeletal ROS: negative for - joint pain or joint stiffness Neurological ROS: negative for - bowel and bladder control changes Dermatological ROS: negative for rash and skin lesion changes   Objective:  Blood pressure 120/76, pulse 80, height 5\' 8"  (1.727 m), weight (!) 318 lb 3.2 oz (144.3 kg).  Body mass index is 48.94 kg/m.  General Appearance:    Alert, cooperative, no distress, appears stated age  Head:    Normocephalic, without obvious abnormality, atraumatic  HEENT:   grossly normal  Lungs:     Clear to auscultation bilaterally, respirations unlabored   Heart:    Regular rate and rhythm, S1 and S2 normal, no murmur, rub or gallop  Breast Exam:    No tenderness, masses, or nipple abnormality. Several moles under right breast  Abdomen:     Soft, non-tender, bowel sounds active all four quadrants, no masses, no organomegaly.    Genitalia:    Pelvic:external genitalia appears normal.  Vagina without lesions, discharge, or tenderness, rectovaginal septum  normal. Cervix normal in appearance, no cervical motion tenderness, no adnexal masses or tenderness.  Uterus normal size, shape, mobile, regular contours, nontender.  Rectal:    Normal external sphincter.  No hemorrhoids appreciated. Internal exam not done.   Extremities:   Extremities normal,  atraumatic, no cyanosis or edema  Skin:  Several moles along abdomen and trunk, also below right breast.       Labs: Lab Results  Component Value Date   WBC 6.3 07/09/2017   HGB 15.5 07/09/2017   HCT 45.0 07/09/2017   MCV 92 07/09/2017   PLT 223 07/09/2017    Lab Results  Component Value Date   CREATININE 0.83 07/09/2017   BUN 11 07/09/2017   NA 141 07/09/2017   K 5.1 07/09/2017   CL 101 07/09/2017   CO2 25 07/09/2017    Lab Results  Component Value Date   ALT 22 07/09/2017   AST 21 07/09/2017   ALKPHOS 59 07/09/2017   BILITOT 0.6 07/09/2017    Lab Results  Component Value Date   CHOL 228 (H) 07/09/2017   HDL 61 07/09/2017   LDLCALC 138 (H) 07/09/2017   TRIG 146 07/09/2017   CHOLHDL 3.7 07/09/2017    Lab Results  Component Value Date   TSH 2.400 07/09/2017    No results found for: HGBA1C   Assessment   1. Encounter for well woman exam with routine gynecological exam   2. Breast cancer screening by mammogram   3. Lichen sclerosus of female genitalia   4. Dyslipidemia   5. Acquired hypothyroidism   6. Essential hypertension     Plan:   - Blood tests: continue to follow with PCP. - Breast self exam technique reviewed and patient encouraged to perform self-exam monthly. - Mammogram up to date. Continue yearly routine screening.   - Contraception: status post hysterectomy, retains cervix. - Discussed healthy lifestyle modifications. Encouraged modified exercises and stretches due to Achilles tendonitis in left foot. Also noted having issues with portion control, discussed dietary/behavioral modifications.  - Mammogram UTD, normal (10/2018). Order placed. - Colonoscopy UTD (07/2017), repeat in 5 years (07/2022). - Pap smear UTD, normal. Patient still retains cervix after hysterectomy. Continue routine screening.  - Lichens sclerosis managed with clobetasol.  Refill placed per request. - Multiple comorbidities (HTN, hypothyroidism, depression)-- managed by  PCP.  - COVID-19 Vaccination status: patient has had COVID infection, as  well as completed vaccination series. Return to [place] or as needed   I have seen and examined the patient with Doristine Locks, [place] PA-S.  I have reviewed the record and concur with patient management and plan.   Rubie Maid, MD [facility] [place] [facility]

## 2019-11-18 NOTE — Progress Notes (Signed)
Pt present for annual exam. Pt stated that she was doing well no problems.  

## 2019-11-18 NOTE — Patient Instructions (Addendum)
Preventive Care 40-59 Years Old, Female Preventive care refers to visits with your health care provider and lifestyle choices that can promote health and wellness. This includes:  A yearly physical exam. This may also be called an annual well check.  Regular dental visits and eye exams.  Immunizations.  Screening for certain conditions.  Healthy lifestyle choices, such as eating a healthy diet, getting regular exercise, not using drugs or products that contain nicotine and tobacco, and limiting alcohol use. What can I expect for my preventive care visit? Physical exam Your health care provider will check your:  Height and weight. This may be used to calculate body mass index (BMI), which tells if you are at a healthy weight.  Heart rate and blood pressure.  Skin for abnormal spots. Counseling Your health care provider may ask you questions about your:  Alcohol, tobacco, and drug use.  Emotional well-being.  Home and relationship well-being.  Sexual activity.  Eating habits.  Work and work environment.  Method of birth control.  Menstrual cycle.  Pregnancy history. What immunizations do I need?  Influenza (flu) vaccine  This is recommended every year. Tetanus, diphtheria, and pertussis (Tdap) vaccine  You may need a Td booster every 10 years. Varicella (chickenpox) vaccine  You may need this if you have not been vaccinated. Zoster (shingles) vaccine  You may need this after age 60. Measles, mumps, and rubella (MMR) vaccine  You may need at least one dose of MMR if you were born in 1957 or later. You may also need a second dose. Pneumococcal conjugate (PCV13) vaccine  You may need this if you have certain conditions and were not previously vaccinated. Pneumococcal polysaccharide (PPSV23) vaccine  You may need one or two doses if you smoke cigarettes or if you have certain conditions. Meningococcal conjugate (MenACWY) vaccine  You may need this if you  have certain conditions. Hepatitis A vaccine  You may need this if you have certain conditions or if you travel or work in places where you may be exposed to hepatitis A. Hepatitis B vaccine  You may need this if you have certain conditions or if you travel or work in places where you may be exposed to hepatitis B. Haemophilus influenzae type b (Hib) vaccine  You may need this if you have certain conditions. Human papillomavirus (HPV) vaccine  If recommended by your health care provider, you may need three doses over 6 months. You may receive vaccines as individual doses or as more than one vaccine together in one shot (combination vaccines). Talk with your health care provider about the risks and benefits of combination vaccines. What tests do I need? Blood tests  Lipid and cholesterol levels. These may be checked every 5 years, or more frequently if you are over 50 years old.  Hepatitis C test.  Hepatitis B test. Screening  Lung cancer screening. You may have this screening every year starting at age 55 if you have a 30-pack-year history of smoking and currently smoke or have quit within the past 15 years.  Colorectal cancer screening. All adults should have this screening starting at age 50 and continuing until age 75. Your health care provider may recommend screening at age 45 if you are at increased risk. You will have tests every 1-10 years, depending on your results and the type of screening test.  Diabetes screening. This is done by checking your blood sugar (glucose) after you have not eaten for a while (fasting). You may have this   done every 1-3 years.  Mammogram. This may be done every 1-2 years. Talk with your health care provider about when you should start having regular mammograms. This may depend on whether you have a family history of breast cancer.  BRCA-related cancer screening. This may be done if you have a family history of breast, ovarian, tubal, or peritoneal  cancers.  Pelvic exam and Pap test. This may be done every 3 years starting at age 70. Starting at age 87, this may be done every 5 years if you have a Pap test in combination with an HPV test. Other tests  Sexually transmitted disease (STD) testing.  Bone density scan. This is done to screen for osteoporosis. You may have this scan if you are at high risk for osteoporosis. Follow these instructions at home: Eating and drinking  Eat a diet that includes fresh fruits and vegetables, whole grains, lean protein, and low-fat dairy.  Take vitamin and mineral supplements as recommended by your health care provider.  Do not drink alcohol if: ? Your health care provider tells you not to drink. ? You are pregnant, may be pregnant, or are planning to become pregnant.  If you drink alcohol: ? Limit how much you have to 0-1 drink a day. ? Be aware of how much alcohol is in your drink. In the [place] one drink equals one 12 oz bottle of beer (355 mL), one 5 oz glass of wine (148 mL), or one 1 oz glass of hard liquor (44 mL). Lifestyle  Take daily care of your teeth and gums.  Stay active. Exercise for at least 30 minutes on 5 or more days each week.  Do not use any products that contain nicotine or tobacco, such as cigarettes, e-cigarettes, and chewing tobacco. If you need help quitting, ask your health care provider.  If you are sexually active, practice safe sex. Use a condom or other form of birth control (contraception) in order to prevent pregnancy and STIs (sexually transmitted infections).  If told by your health care provider, take low-dose aspirin daily starting at age 59. What's next?  Visit your health care provider once a year for a well check visit.  Ask your health care provider how often you should have your eyes and teeth checked.  Stay up to date on all vaccines. This information is not intended to replace advice given to you by your health care provider. Make sure you  discuss any questions you have with your health care provider. Document Revised: 12/17/2017 Document Reviewed: 12/17/2017 Elsevier Patient Education  2020 [employer] Breast self-awareness is knowing how your breasts look and feel. Doing breast self-awareness is important. It allows you to catch a breast problem early while it is still small and can be treated. All women should do breast self-awareness, including women who have had breast implants. Tell your doctor if you notice a change in your breasts. What you need:  A mirror.  A well-lit room. How to do a breast self-exam A breast self-exam is one way to learn what is normal for your breasts and to check for changes. To do a breast self-exam: Look for changes  1. Take off all the clothes above your waist. 2. Stand in front of a mirror in a room with good lighting. 3. Put your hands on your hips. 4. Push your hands down. 5. Look at your breasts and nipples in the mirror to see if one breast or nipple looks different  other. Check to see if: ? The shape of one breast is different. ? The size of one breast is different. ? There are wrinkles, dips, and bumps in one breast and not the other. 6. Look at each breast for changes in the skin, such as: ? Redness. ? Scaly areas. 7. Look for changes in your nipples, such as: ? Liquid around the nipples. ? Bleeding. ? Dimpling. ? Redness. ? A change in where the nipples are. Feel for changes  1. Lie on your back on the floor. 2. Feel each breast. To do this, follow these steps: ? Pick a breast to feel. ? Put the arm closest to that breast above your head. ? Use your other arm to feel the nipple area of your breast. Feel the area with the pads of your three middle fingers by making small circles with your fingers. For the first circle, press lightly. For the second circle, press harder. For the third circle, press even harder. ? Keep making circles with  your fingers at the different pressures as you move down your breast. Stop when you feel your ribs. ? Move your fingers a little toward the center of your body. ? Start making circles with your fingers again, this time going up until you reach your collarbone. ? Keep making up-and-down circles until you reach your armpit. Remember to keep using the three pressures. ? Feel the other breast in the same way. 3. Sit or stand in the tub or shower. 4. With soapy water on your skin, feel each breast the same way you did in step 2 when you were lying on the floor. Write down what you find Writing down what you find can help you remember what to tell your doctor. Write down:  What is normal for each breast.  Any changes you find in each breast, including: ? The kind of changes you find. ? Whether you have pain. ? Size and location of any lumps.  When you last had your menstrual period. General tips  Check your breasts every month.  If you are breastfeeding, the best time to check your breasts is after you feed your baby or after you use a breast pump.  If you get menstrual periods, the best time to check your breasts is 5-7 days after your menstrual period is over.  With time, you will become comfortable with the self-exam, and you will begin to know if there are changes in your breasts. Contact a doctor if you:  See a change in the shape or size of your breasts or nipples.  See a change in the skin of your breast or nipples, such as red or scaly skin.  Have fluid coming from your nipples that is not normal.  Find a lump or thick area that was not there before.  Have pain in your breasts.  Have any concerns about your breast health. Summary  Breast self-awareness includes looking for changes in your breasts, as well as feeling for changes within your breasts.  Breast self-awareness should be done in front of a mirror in a well-lit room.  You should check your breasts every month.  If you get menstrual periods, the best time to check your breasts is 5-7 days after your menstrual period is over.  Let your doctor know of any changes you see in your breasts, including changes in size, changes on the skin, pain or tenderness, or fluid from your nipples that is not normal. This information is not   is not intended to replace advice given to you by your health care provider. Make sure you discuss any questions you have with your health care provider. Document Revised: 11/24/2017 Document Reviewed: 11/24/2017 Elsevier Patient Education  [employer].

## 2020-02-09 ENCOUNTER — Other Ambulatory Visit: Payer: Self-pay

## 2020-02-09 ENCOUNTER — Ambulatory Visit
Admission: RE | Admit: 2020-02-09 | Discharge: 2020-02-09 | Disposition: A | Discharge status: Home / Self Care | Payer: PRIVATE HEALTH INSURANCE | Source: Ambulatory Visit | Attending: Obstetrics and Gynecology | Admitting: Obstetrics and Gynecology

## 2020-02-09 DIAGNOSIS — Z1231 Encounter for screening mammogram for malignant neoplasm of breast: Secondary | ICD-10-CM | POA: Diagnosis present

## 2020-03-28 ENCOUNTER — Encounter: Payer: PRIVATE HEALTH INSURANCE | Admitting: Obstetrics and Gynecology

## 2020-06-19 DIAGNOSIS — R7689 Other specified abnormal immunological findings in serum: Secondary | ICD-10-CM | POA: Insufficient documentation

## 2020-07-18 DIAGNOSIS — R739 Hyperglycemia, unspecified: Secondary | ICD-10-CM | POA: Insufficient documentation

## 2020-07-18 DIAGNOSIS — R3121 Asymptomatic microscopic hematuria: Secondary | ICD-10-CM | POA: Insufficient documentation

## 2020-08-06 ENCOUNTER — Other Ambulatory Visit: Payer: Self-pay | Admitting: Specialist

## 2020-08-06 DIAGNOSIS — J90 Pleural effusion, not elsewhere classified: Secondary | ICD-10-CM

## 2020-08-07 ENCOUNTER — Other Ambulatory Visit
Admission: RE | Admit: 2020-08-07 | Discharge: 2020-08-07 | Disposition: A | Discharge status: Home / Self Care | Payer: PRIVATE HEALTH INSURANCE | Source: Ambulatory Visit | Attending: Specialist | Admitting: Specialist

## 2020-08-07 ENCOUNTER — Other Ambulatory Visit: Payer: Self-pay

## 2020-08-07 ENCOUNTER — Ambulatory Visit
Admission: RE | Admit: 2020-08-07 | Discharge: 2020-08-07 | Disposition: A | Discharge status: Home / Self Care | Payer: PRIVATE HEALTH INSURANCE | Source: Ambulatory Visit | Attending: Specialist | Admitting: Specialist

## 2020-08-07 ENCOUNTER — Other Ambulatory Visit: Payer: Self-pay | Admitting: Interventional Radiology

## 2020-08-07 ENCOUNTER — Ambulatory Visit
Admission: RE | Admit: 2020-08-07 | Discharge: 2020-08-07 | Disposition: A | Discharge status: Home / Self Care | Payer: PRIVATE HEALTH INSURANCE | Source: Ambulatory Visit | Attending: Interventional Radiology | Admitting: Interventional Radiology

## 2020-08-07 DIAGNOSIS — Z9889 Other specified postprocedural states: Secondary | ICD-10-CM | POA: Diagnosis not present

## 2020-08-07 DIAGNOSIS — Z20822 Contact with and (suspected) exposure to covid-19: Secondary | ICD-10-CM | POA: Insufficient documentation

## 2020-08-07 DIAGNOSIS — J90 Pleural effusion, not elsewhere classified: Secondary | ICD-10-CM

## 2020-08-07 LAB — LACTATE DEHYDROGENASE, PLEURAL OR PERITONEAL FLUID: LD, Fluid: 115 U/L — ABNORMAL HIGH (ref 3–23)

## 2020-08-07 LAB — GLUCOSE, PLEURAL OR PERITONEAL FLUID: Glucose, Fluid: 86 mg/dL

## 2020-08-07 LAB — BODY FLUID CELL COUNT WITH DIFFERENTIAL
Eos, Fluid: 0 %
Lymphs, Fluid: 79 %
Monocyte-Macrophage-Serous Fluid: 10 %
Neutrophil Count, Fluid: 11 %
Total Nucleated Cell Count, Fluid: 961 cu mm

## 2020-08-07 LAB — PROTEIN, PLEURAL OR PERITONEAL FLUID: Total protein, fluid: 3.7 g/dL

## 2020-08-07 LAB — SARS CORONAVIRUS 2 BY RT PCR (HOSPITAL ORDER, PERFORMED IN ~~LOC~~ HOSPITAL LAB): SARS Coronavirus 2: NEGATIVE

## 2020-08-07 LAB — AMYLASE, PLEURAL OR PERITONEAL FLUID: Amylase, Fluid: 16 U/L

## 2020-08-08 DIAGNOSIS — M329 Systemic lupus erythematosus, unspecified: Secondary | ICD-10-CM | POA: Insufficient documentation

## 2020-08-09 DIAGNOSIS — M329 Systemic lupus erythematosus, unspecified: Secondary | ICD-10-CM

## 2020-08-09 HISTORY — DX: Systemic lupus erythematosus, unspecified: M32.9

## 2020-08-09 LAB — CYTOLOGY - NON PAP

## 2020-08-10 LAB — CHOLESTEROL, BODY FLUID: Cholesterol, Fluid: 106 mg/dL

## 2020-08-10 LAB — FUNGUS CULTURE WITH STAIN

## 2020-08-10 LAB — BODY FLUID CULTURE W GRAM STAIN

## 2020-08-10 IMAGING — MG DIGITAL SCREENING BILATERAL MAMMOGRAM WITH TOMO AND CAD
6 of 12 series · 6 of 36 positions shown · non-contrast
Comparison: Previous exam(s).

CLINICAL DATA: Screening.

EXAM:
DIGITAL SCREENING BILATERAL MAMMOGRAM WITH TOMO AND CAD

[L CC synth-2D (1 of 2)]
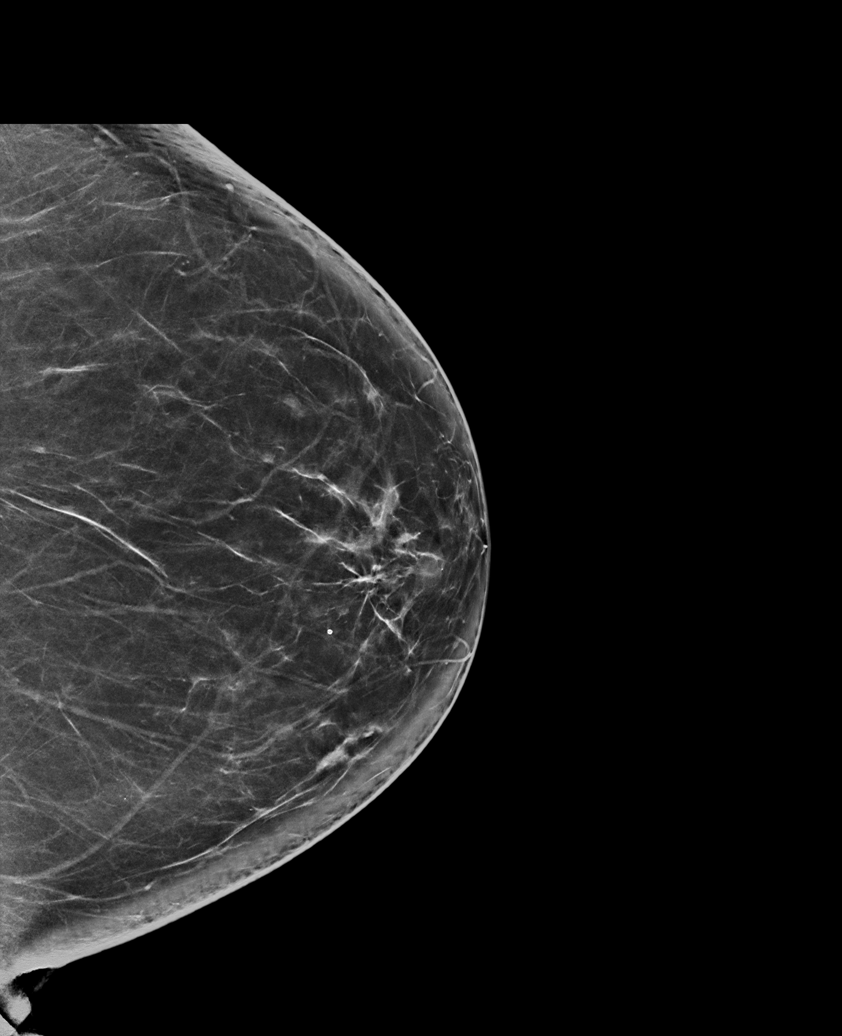

[L CC synth-2D (2 of 2)]
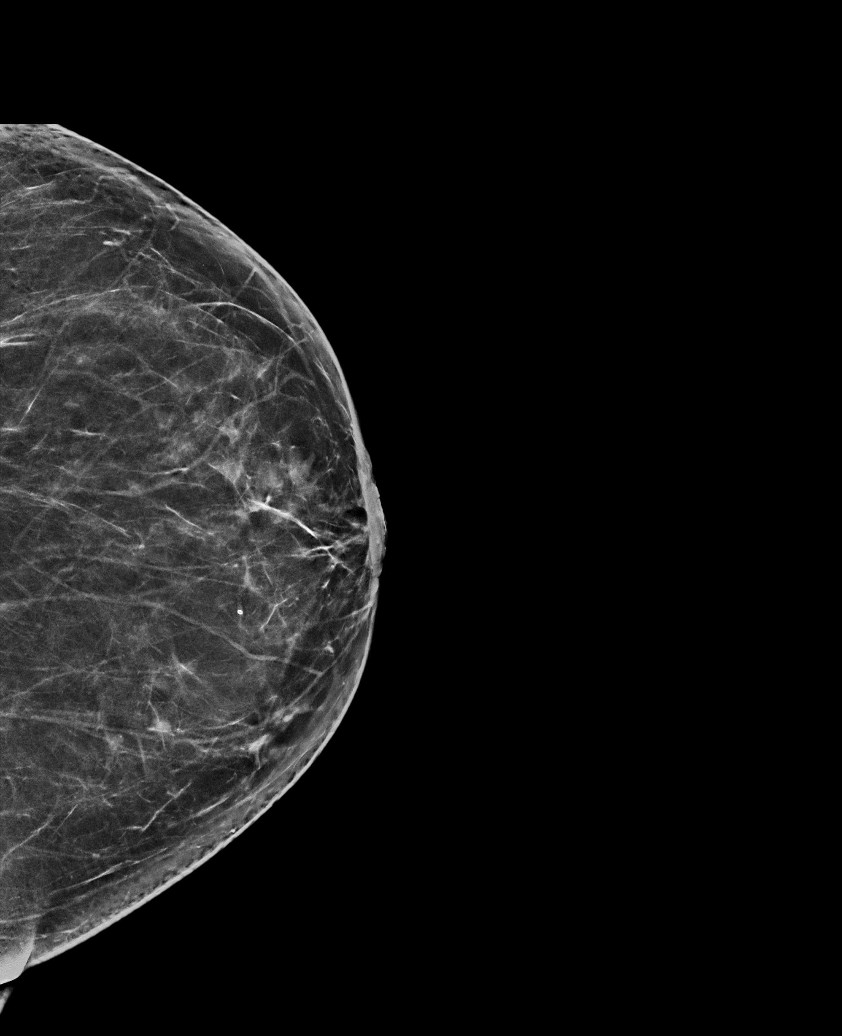

[R CC synth-2D (1 of 2)]
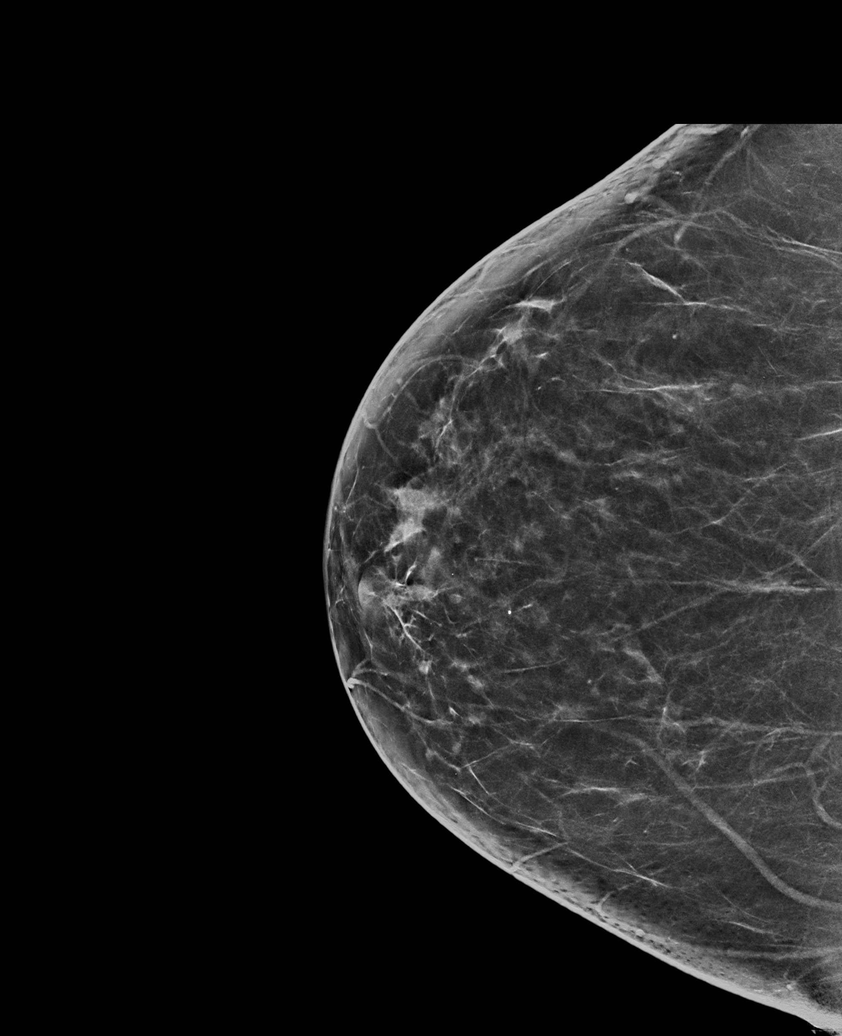

[R MLO synth-2D]
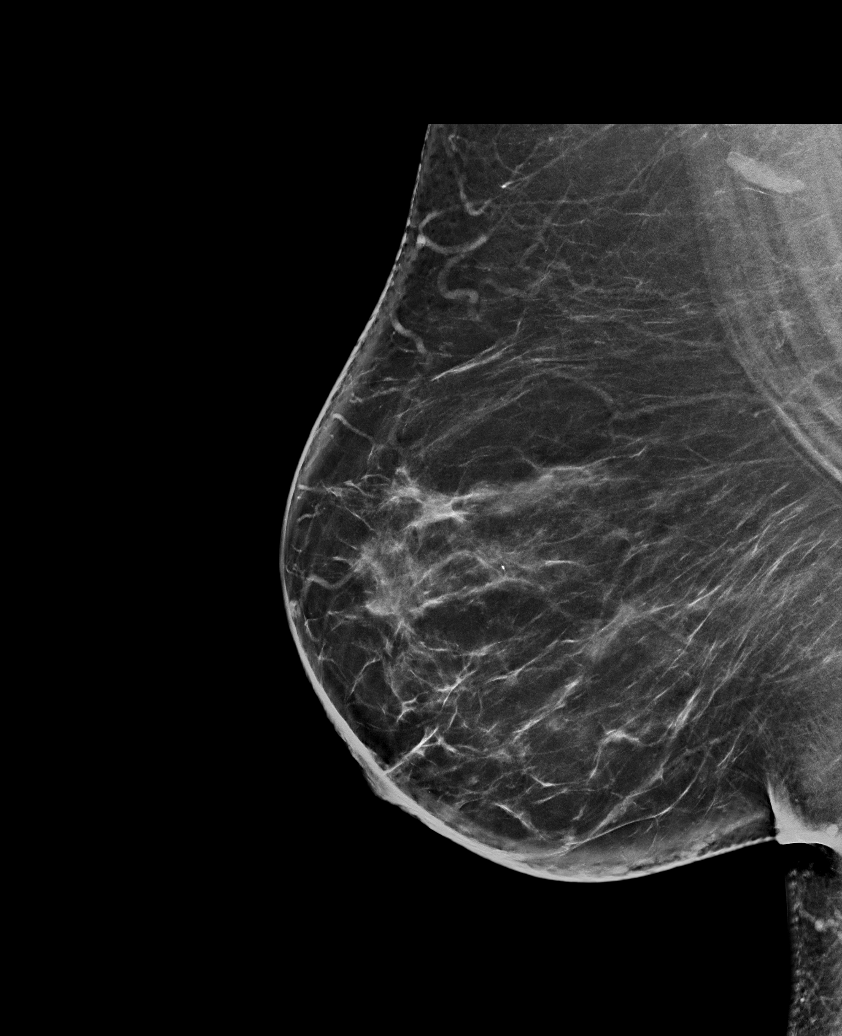

[L MLO synth-2D]
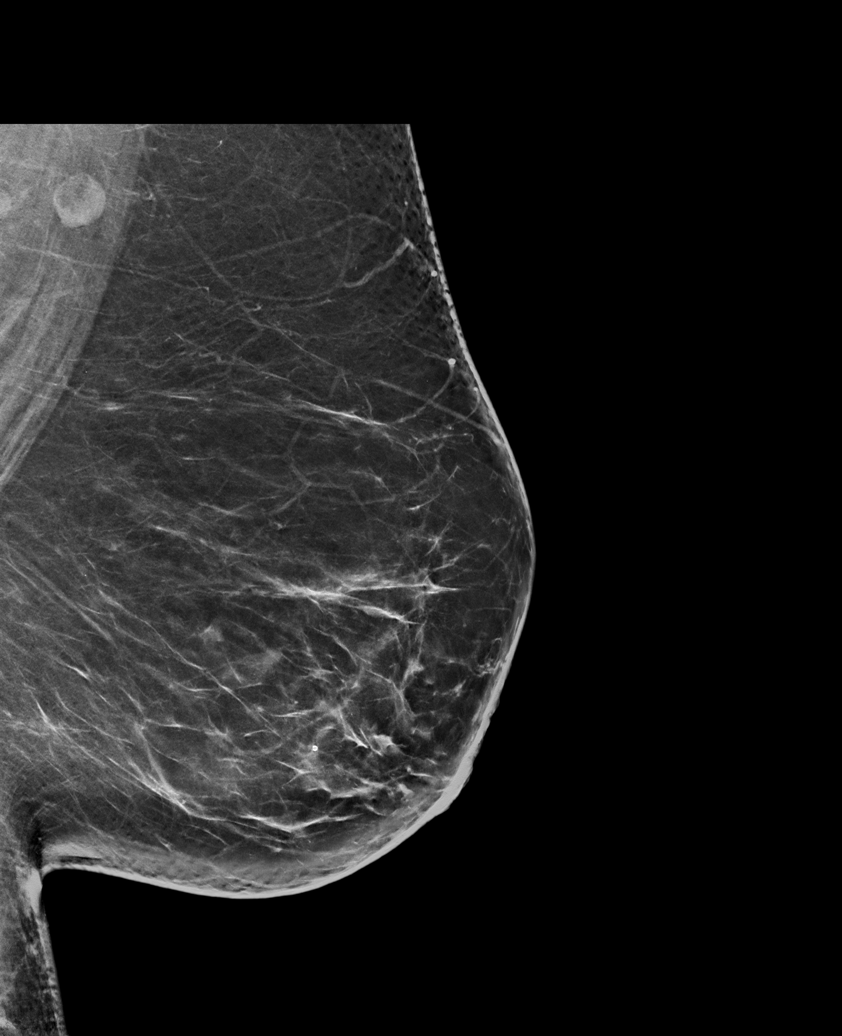

[R CC synth-2D (2 of 2)]
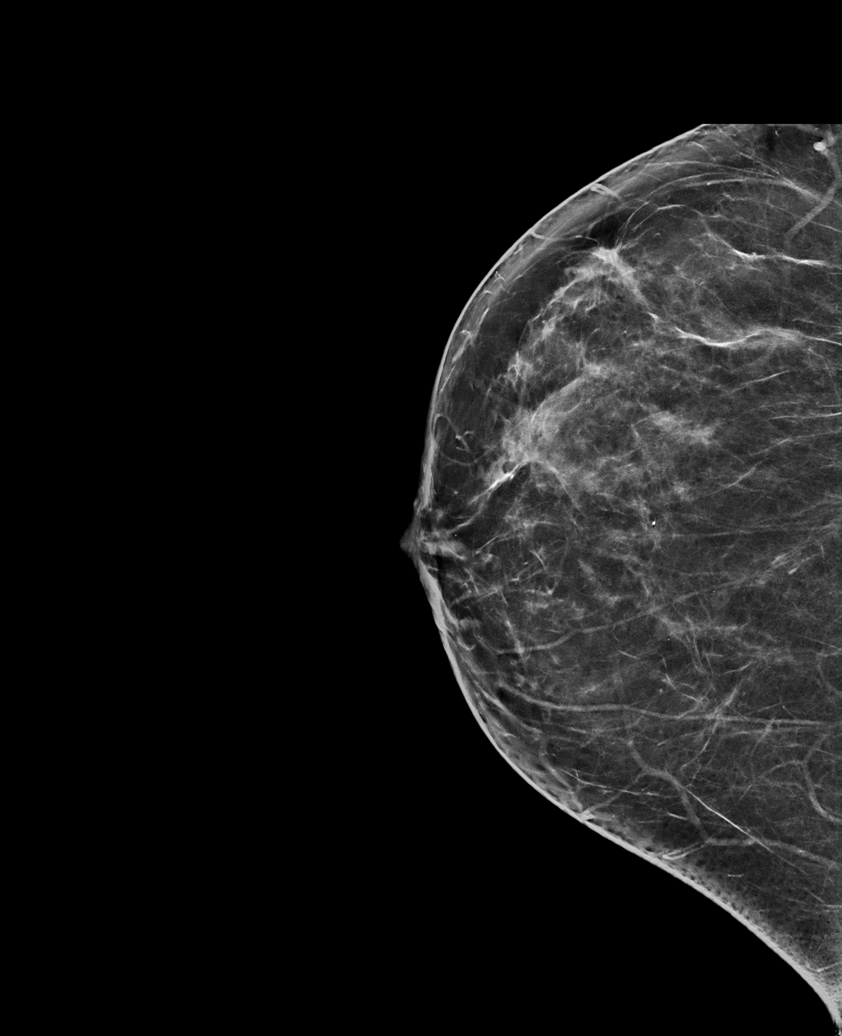

[6 of 36 positions shown; findings below may reference images not displayed]

ACR Breast Density Category b: There are scattered areas of
fibroglandular density.
FINDINGS: There are no findings suspicious for malignancy. Images were
processed with CAD.
IMPRESSION: No mammographic evidence of malignancy. A result letter of this
screening mammogram will be mailed directly to the patient.

RECOMMENDATION:
Screening mammogram in one year. (Code:[id number])

BI-RADS CATEGORY  1: Negative.

## 2020-08-11 LAB — BODY FLUID CULTURE W GRAM STAIN: Culture: NO GROWTH

## 2020-08-14 LAB — ACID FAST SMEAR (AFB, MYCOBACTERIA): Acid Fast Smear: NEGATIVE

## 2020-08-22 ENCOUNTER — Other Ambulatory Visit: Payer: Self-pay

## 2020-08-22 ENCOUNTER — Ambulatory Visit
Admission: EM | Admit: 2020-08-22 | Discharge: 2020-08-22 | Disposition: A | Discharge status: Home / Self Care | Payer: PRIVATE HEALTH INSURANCE | Attending: Family Medicine | Admitting: Family Medicine

## 2020-08-22 ENCOUNTER — Ambulatory Visit (INDEPENDENT_AMBULATORY_CARE_PROVIDER_SITE_OTHER): Payer: PRIVATE HEALTH INSURANCE

## 2020-08-22 DIAGNOSIS — R0602 Shortness of breath: Secondary | ICD-10-CM | POA: Diagnosis present

## 2020-08-22 DIAGNOSIS — Z8616 Personal history of COVID-19: Secondary | ICD-10-CM | POA: Diagnosis not present

## 2020-08-22 DIAGNOSIS — Z7982 Long term (current) use of aspirin: Secondary | ICD-10-CM | POA: Diagnosis not present

## 2020-08-22 DIAGNOSIS — J069 Acute upper respiratory infection, unspecified: Secondary | ICD-10-CM | POA: Diagnosis not present

## 2020-08-22 DIAGNOSIS — Z20822 Contact with and (suspected) exposure to covid-19: Secondary | ICD-10-CM | POA: Insufficient documentation

## 2020-08-22 DIAGNOSIS — R059 Cough, unspecified: Secondary | ICD-10-CM

## 2020-08-22 DIAGNOSIS — J90 Pleural effusion, not elsewhere classified: Secondary | ICD-10-CM | POA: Insufficient documentation

## 2020-08-22 DIAGNOSIS — Z880 Allergy status to penicillin: Secondary | ICD-10-CM | POA: Diagnosis not present

## 2020-08-22 DIAGNOSIS — Z79899 Other long term (current) drug therapy: Secondary | ICD-10-CM | POA: Insufficient documentation

## 2020-08-22 DIAGNOSIS — Z87891 Personal history of nicotine dependence: Secondary | ICD-10-CM | POA: Diagnosis not present

## 2020-08-22 LAB — SARS CORONAVIRUS 2 (TAT 6-24 HRS): SARS Coronavirus 2: NEGATIVE

## 2020-08-22 MED ORDER — IPRATROPIUM BROMIDE 0.06 % NA SOLN: 2.0000 | Freq: Four times a day (QID) | NASAL | 0 refills | Status: DC | PRN

## 2020-08-22 MED ORDER — PROMETHAZINE-DM 6.25-15 MG/5ML PO SYRP: 5.0000 mL | ORAL_SOLUTION | Freq: Four times a day (QID) | ORAL | 0 refills | Status: DC | PRN

## 2020-08-22 MED ORDER — PREDNISONE 10 MG PO TABS: ORAL_TABLET | ORAL | 0 refills | Status: AC

## 2020-08-22 NOTE — ED Triage Notes (Signed)
Patient c/o bilateral rib pain that started a month ago.  Patient reports cough, SOB, runny nose that started yesterday.

## 2020-08-22 NOTE — ED Provider Notes (Signed)
[facility]    CSN: [id number] Arrival date & time: 08/22/20  1201      History   Chief Complaint Chief Complaint  Patient presents with  . Shortness of Breath  . Cough  . Sore Throat   HPI  60 year old female presents with the above complaints.  Patient has been dealing with rheumatologic issues and pleural effusions.  Had recent thoracentesis.  Fluid analysis revealed lymphocyte predominant effusion.  Cultures negative thus far.  Patient currently on corticosteroid and Plaquenil.  Patient reports that she is having worsening shortness of breath.  Seems to be worse with activity.  She reports that she developed upper respiratory symptoms on Monday.  She reports sore throat which has resolved.  Also has had congestion, cough, and runny nose.  She feels poorly.  She is very concerned about her ongoing issues.  No fever.  No other complaints.  Past Medical History:  Diagnosis Date  . Depression   . History of 2019 novel coronavirus disease (COVID-19) 12/21/2018  . Hypertension   . Menopause   . Obese   . Plantar fasciitis   . Thyroid disease   . Trigeminal neuralgia     Patient Active Problem List   Diagnosis Date Noted  . History of 2019 novel coronavirus disease (COVID-19) 01/19/2019  . Lichen sclerosus of female genitalia 12/16/2018  . Colon cancer screening   . Stress incontinence 08/08/2015    Past Surgical History:  Procedure Laterality Date  . ABDOMINAL HYSTERECTOMY     Retains cervix. Villalba- DR [place]-D/T- HEAVY MENSES  . CHOLECYSTECTOMY    . COLONOSCOPY WITH PROPOFOL N/A 08/10/2017   Procedure: COLONOSCOPY WITH PROPOFOL;  Surgeon: Lin Landsman, MD;  Location: [facility] ENDOSCOPY;  Service: Gastroenterology;  Laterality: N/A;  . FACIAL SIALOCELE MARSUPILIZATION      OB History    Gravida  2   Para  2   Term  2   Preterm      AB      Living        SAB      IAB      Ectopic      Multiple      Live Births                Home Medications    Prior to Admission medications   Medication Sig Start Date End Date Taking? Authorizing Provider  aspirin EC 81 MG tablet Take 81 mg by mouth daily.   Yes [provider]  carbamazepine (TEGRETOL) 200 MG tablet Take 100 mg by mouth 3 (three) times daily.   Yes [provider]  citalopram (CELEXA) 40 MG tablet Take by mouth. 02/21/08  Yes [provider]  Cyanocobalamin (HM VITAMIN B-12 ULTRA STRENGTH) 5000 MCG TBDP Take by mouth.   Yes [provider]  hydroxychloroquine (PLAQUENIL) 200 MG tablet Take 1 tablet by mouth 2 (two) times daily. 07/30/20  Yes [provider]  ipratropium (ATROVENT) 0.06 % nasal spray Place 2 sprays into both nostrils 4 (four) times daily as needed for rhinitis. 08/22/20  Yes Racquelle Hyser G, DO  levothyroxine (SYNTHROID, LEVOTHROID) 125 MCG tablet Take 125 mcg by mouth daily before breakfast.   Yes [provider]  lisinopril (PRINIVIL,ZESTRIL) 10 MG tablet Take 10 mg by mouth daily.   Yes [provider]  predniSONE (DELTASONE) 10 MG tablet 40 mg daily x 1 week, then 30 mg daily x 1 week, then 20 mg daily x 1 week,  then 10 mg daily x 1 week, then 5 mg daily x 1 week. 08/22/20  Yes Shereda Graw G, DO  promethazine-dextromethorphan (PROMETHAZINE-DM) 6.25-15 MG/5ML syrup Take 5 mLs by mouth 4 (four) times daily as needed for cough. 08/22/20  Yes Deniro Laymon G, DO  clobetasol ointment (TEMOVATE) 0.05 % Apply topically at bedtime as needed. 11/18/19   Rubie Maid, MD  gabapentin (NEURONTIN) 300 MG capsule Take 300 mg by mouth at bedtime.    [provider]    Family History Family History  Problem Relation Age of Onset  . Breast cancer Mother 36  . Coronary artery disease Father   . Cancer Father        prostate  . Heart disease Father   . Gout Father     Social History Social History   Tobacco Use  . Smoking status: Former Smoker    Quit date: 04/21/1990    Years since  quitting: 30.3  . Smokeless tobacco: Never Used  Vaping Use  . Vaping Use: Never used  Substance Use Topics  . Alcohol use: No  . Drug use: No     Allergies   Penicillins   Review of Systems Review of Systems Per HPI  Physical Exam Triage Vital Signs ED Triage Vitals  Enc Vitals Group     BP 08/22/20 1216 115/72     Pulse Rate 08/22/20 1216 89     Resp 08/22/20 1216 17     Temp 08/22/20 1216 98.9 F (37.2 C)     Temp Source 08/22/20 1216 Oral     SpO2 08/22/20 1216 95 %     Weight 08/22/20 1212 281 lb (127.5 kg)     Height 08/22/20 1212 5\' 8"  (1.727 m)     Head Circumference --      Peak Flow --      Pain Score 08/22/20 1212 6     Pain Loc --      Pain Edu? --      Excl. in [place]? --    Updated Vital Signs BP 115/72 (BP Location: Left Arm)   Pulse 89   Temp 98.9 F (37.2 C) (Oral)   Resp 17   Ht 5\' 8"  (1.727 m)   Wt 127.5 kg   SpO2 95%   BMI 42.73 kg/m   Visual Acuity Right Eye Distance:   Left Eye Distance:   Bilateral Distance:    Right Eye Near:   Left Eye Near:    Bilateral Near:     Physical Exam Vitals and nursing note reviewed.  Constitutional:      General: She is not in acute distress.    Appearance: She is obese.  HENT:     Head: Normocephalic and atraumatic.     Right Ear: Tympanic membrane normal.     Left Ear: Tympanic membrane normal.     Mouth/Throat:     Pharynx: Oropharynx is clear. No oropharyngeal exudate.  Eyes:     General:        Right eye: No discharge.        Left eye: No discharge.     Conjunctiva/sclera: Conjunctivae normal.  Cardiovascular:     Rate and Rhythm: Normal rate and regular rhythm.  Pulmonary:     Effort: Pulmonary effort is normal.     Comments: Basilar crackles. Neurological:     Mental Status: She is alert.  Psychiatric:        Mood and Affect: Mood normal.  Behavior: Behavior normal.    UC Treatments / Results  Labs (all labs ordered are listed, but only abnormal results are  displayed) Labs Reviewed  SARS CORONAVIRUS 2 (TAT 6-24 HRS)    EKG   Radiology DG Chest 2 View  Result Date: 08/22/2020 CLINICAL DATA:  Cough, shortness of breath EXAM: CHEST - 2 VIEW COMPARISON:  08/07/2020 FINDINGS: Stable cardiomegaly. Trace bilateral pleural effusions, decreased from prior. Upper lung fields are clear. No pneumothorax. IMPRESSION: Trace bilateral pleural effusions, decreased from prior. Electronically Signed   By: Davina Poke D.O.   On: 08/22/2020 13:10    Procedures Procedures (including critical care time)  Medications Ordered in UC Medications - No data to display  Initial Impression / Assessment and Plan / UC Course  I have reviewed the triage vital signs and the nursing notes.  Pertinent labs & imaging results that were available during my care of the patient were reviewed by me and considered in my medical decision making (see chart for details).    60 year old female presents for evaluation the above.  Chest x-ray was obtained and was independent interpreted by me.  Interpretation: Trace bilateral pleural effusions.  I discussed this with the patient.  I am concerned that this is inflammatory in nature.  We discussed increasing prednisone and she was in agreement.  Prednisone taper sent.  Patient also having ongoing upper respiratory symptoms.  This is likely viral and will slowly improve.  Atrovent nasal spray and Promethazine DM.  Final Clinical Impressions(s) / UC Diagnoses   Final diagnoses:  Chronic bilateral pleural effusions  Viral upper respiratory tract infection     Discharge Instructions     Medications as prescribed.  Call for sooner follow up with rheumatology and pulmonology.  Take care  Dr. Lacinda Axon    ED Prescriptions    Medication Sig Dispense Auth. Provider   ipratropium (ATROVENT) 0.06 % nasal spray Place 2 sprays into both nostrils 4 (four) times daily as needed for rhinitis. 15 mL Sevana Grandinetti G, DO    promethazine-dextromethorphan (PROMETHAZINE-DM) 6.25-15 MG/5ML syrup Take 5 mLs by mouth 4 (four) times daily as needed for cough. 118 mL Jalah Warmuth G, DO   predniSONE (DELTASONE) 10 MG tablet 40 mg daily x 1 week, then 30 mg daily x 1 week, then 20 mg daily x 1 week, then 10 mg daily x 1 week, then 5 mg daily x 1 week. 74 tablet Thersa Salt G, DO     PDMP not reviewed this encounter.   Coral Spikes, [place] 08/22/20 1558

## 2020-08-22 NOTE — Discharge Instructions (Addendum)
Medications as prescribed.  Call for sooner follow up with rheumatology and pulmonology.  Take care  Dr. Lacinda Axon

## 2020-09-06 LAB — FUNGUS CULTURE WITH STAIN

## 2020-09-06 LAB — FUNGUS CULTURE RESULT

## 2020-09-06 LAB — FUNGAL ORGANISM REFLEX

## 2020-09-18 ENCOUNTER — Other Ambulatory Visit: Payer: Self-pay

## 2020-09-18 ENCOUNTER — Ambulatory Visit (INDEPENDENT_AMBULATORY_CARE_PROVIDER_SITE_OTHER): Payer: PRIVATE HEALTH INSURANCE

## 2020-09-18 ENCOUNTER — Ambulatory Visit
Admission: EM | Admit: 2020-09-18 | Discharge: 2020-09-18 | Disposition: A | Discharge status: Home / Self Care | Payer: PRIVATE HEALTH INSURANCE | Attending: Emergency Medicine | Admitting: Emergency Medicine

## 2020-09-18 DIAGNOSIS — R0602 Shortness of breath: Secondary | ICD-10-CM

## 2020-09-18 DIAGNOSIS — R059 Cough, unspecified: Secondary | ICD-10-CM

## 2020-09-18 NOTE — Discharge Instructions (Addendum)
Use the Atrovent nasal spray, 2 squirts in each nostril every 6 hours, as needed for runny nose and postnasal drip.  Continue your current prednisone dosing as prescribed by pulmonology.  Use the Promethazine DM cough syrup at bedtime for cough and congestion.  It will make you drowsy so do not take it during the day.  Return for reevaluation or see your primary care provider for any new or worsening symptoms.

## 2020-09-18 NOTE — ED Triage Notes (Signed)
Pt presents with cough and sob, pt has had URI for 3 weeks and hasn't improved, also having upper back pain, has h/o pleurisy and thoracentesis last month

## 2020-09-18 NOTE — ED Provider Notes (Signed)
[facility]    CSN: [id number] Arrival date & time: 09/18/20  0854      History   Chief Complaint Chief Complaint  Patient presents with  . Cough  . Shortness of Breath    HPI Christine Moody is a 60 y.o. female.   HPI   60 year old female here for evaluation of cough and shortness of breath.  Patient reports that she has been experiencing URI symptoms for the past 3 weeks.  She has had a cough and shortness of breath.  The cough was productive last week for which she describes as a creamy yellow sputum but is no longer productive.  She has a history of chronic pleural effusions and had a thoracentesis approximately 16 days ago.  She states that she has had some wheezing, crackles which are baize, runny nose, sore throat, and back pain across her shoulders.  She denies fever, nasal congestion, or ear pain.  Past Medical History:  Diagnosis Date  . Depression   . History of 2019 novel coronavirus disease (COVID-19) 12/21/2018  . Hypertension   . Menopause   . Obese   . Plantar fasciitis   . Thyroid disease   . Trigeminal neuralgia     Patient Active Problem List   Diagnosis Date Noted  . History of 2019 novel coronavirus disease (COVID-19) 01/19/2019  . Lichen sclerosus of female genitalia 12/16/2018  . Colon cancer screening   . Stress incontinence 08/08/2015    Past Surgical History:  Procedure Laterality Date  . ABDOMINAL HYSTERECTOMY     Retains cervix. [place]- DR [place]-D/T- HEAVY MENSES  . CHOLECYSTECTOMY    . COLONOSCOPY WITH PROPOFOL N/A 08/10/2017   Procedure: COLONOSCOPY WITH PROPOFOL;  Surgeon: Lin Landsman, MD;  Location: [facility] ENDOSCOPY;  Service: Gastroenterology;  Laterality: N/A;  . FACIAL SIALOCELE MARSUPILIZATION      OB History    Gravida  2   Para  2   Term  2   Preterm      AB      Living        SAB      IAB      Ectopic      Multiple      Live Births               Home Medications    Prior to  Admission medications   Medication Sig Start Date End Date Taking? Authorizing Provider  aspirin EC 81 MG tablet Take 81 mg by mouth daily.    [provider]  carbamazepine (TEGRETOL) 200 MG tablet Take 100 mg by mouth 3 (three) times daily.    [provider]  citalopram (CELEXA) 40 MG tablet Take by mouth. 02/21/08   [provider]  clobetasol ointment (TEMOVATE) 0.05 % Apply topically at bedtime as needed. 11/18/19   Rubie Maid, MD  Cyanocobalamin (HM VITAMIN B-12 ULTRA STRENGTH) 5000 MCG TBDP Take by mouth.    [provider]  gabapentin (NEURONTIN) 300 MG capsule Take 300 mg by mouth at bedtime.    [provider]  hydroxychloroquine (PLAQUENIL) 200 MG tablet Take 1 tablet by mouth 2 (two) times daily. 07/30/20   [provider]  ipratropium (ATROVENT) 0.06 % nasal spray Place 2 sprays into both nostrils 4 (four) times daily as needed for rhinitis. 08/22/20   Coral Spikes, DO  levothyroxine (SYNTHROID, LEVOTHROID) 125 MCG tablet Take 125 mcg by mouth daily before breakfast.    [provider]  lisinopril (PRINIVIL,ZESTRIL) 10 MG tablet Take 10 mg by mouth daily.    [provider]  predniSONE (DELTASONE) 10 MG tablet 40 mg daily x 1 week, then 30 mg daily x 1 week, then 20 mg daily x 1 week, then 10 mg daily x 1 week, then 5 mg daily x 1 week. 08/22/20   Coral Spikes, DO  promethazine-dextromethorphan (PROMETHAZINE-DM) 6.25-15 MG/5ML syrup Take 5 mLs by mouth 4 (four) times daily as needed for cough. 08/22/20   Coral Spikes, DO    Family History Family History  Problem Relation Age of Onset  . Breast cancer Mother 14  . Coronary artery disease Father   . Cancer Father        prostate  . Heart disease Father   . Gout Father     Social History Social History   Tobacco Use  . Smoking status: Former Smoker    Quit date: 04/21/1990    Years since quitting: 30.4  . Smokeless tobacco: Never Used  Vaping Use  .  Vaping Use: Never used  Substance Use Topics  . Alcohol use: No  . Drug use: No     Allergies   Penicillins   Review of Systems Review of Systems  Constitutional: Negative for activity change, appetite change and fever.  HENT: Positive for rhinorrhea and sore throat. Negative for congestion and ear pain.   Respiratory: Positive for cough, shortness of breath and wheezing.   Cardiovascular: Negative for chest pain.     Physical Exam Triage Vital Signs ED Triage Vitals  Enc Vitals Group     BP 09/18/20 0904 135/73     Pulse Rate 09/18/20 0904 72     Resp 09/18/20 0904 (!) 22     Temp 09/18/20 0904 98.2 F (36.8 C)     Temp src --      SpO2 09/18/20 0904 99 %     Weight --      Height --      Head Circumference --      Peak Flow --      Pain Score 09/18/20 0902 5     Pain Loc --      Pain Edu? --      Excl. in [place]? --    No data found.  Updated Vital Signs BP 135/73   Pulse 72   Temp 98.2 F (36.8 C)   Resp (!) 22   SpO2 99%   Visual Acuity Right Eye Distance:   Left Eye Distance:   Bilateral Distance:    Right Eye Near:   Left Eye Near:    Bilateral Near:     Physical Exam Vitals and nursing note reviewed.  Constitutional:      General: She is not in acute distress.    Appearance: Normal appearance. She is normal weight. She is not ill-appearing.  HENT:     Head: Normocephalic and atraumatic.     Right Ear: Tympanic membrane, ear canal and external ear normal. There is no impacted cerumen.     Left Ear: Tympanic membrane, ear canal and external ear normal. There is no impacted cerumen.     Nose: Congestion and rhinorrhea present.     Mouth/Throat:     Mouth: Mucous membranes are moist.     Pharynx: Oropharynx is clear. No posterior oropharyngeal erythema.  Cardiovascular:     Rate and Rhythm: Normal rate and regular rhythm.     Pulses: Normal pulses.  Heart sounds: Normal heart sounds. No murmur heard. No gallop.   Pulmonary:     Effort:  Pulmonary effort is normal.     Breath sounds: Rales present. No wheezing or rhonchi.  Musculoskeletal:     Cervical back: Normal range of motion and neck supple.  Lymphadenopathy:     Cervical: No cervical adenopathy.  Skin:    General: Skin is warm and dry.     Capillary Refill: Capillary refill takes less than 2 seconds.     Findings: No erythema or rash.  Neurological:     General: No focal deficit present.     Mental Status: She is alert and oriented to person, place, and time.  Psychiatric:        Mood and Affect: Mood normal.        Behavior: Behavior normal.        Thought Content: Thought content normal.        Judgment: Judgment normal.      UC Treatments / Results  Labs (all labs ordered are listed, but only abnormal results are displayed) Labs Reviewed - No data to display  EKG   Radiology DG Chest 2 View  Result Date: 09/18/2020 CLINICAL DATA:  60 year old female with history of cough and shortness of breath for the past 3 weeks. EXAM: CHEST - 2 VIEW COMPARISON:  Chest x-ray 08/22/2020. FINDINGS: Mild linear scarring in the left mid lung. Lung volumes are normal. No consolidative airspace disease. No pleural effusions. No pneumothorax. No pulmonary nodule or mass noted. Pulmonary vasculature and the cardiomediastinal silhouette are within normal limits. IMPRESSION: 1.  No radiographic evidence of acute cardiopulmonary disease. Electronically Signed   By: Vinnie Langton M.D.   On: 09/18/2020 09:40    Procedures Procedures (including critical care time)  Medications Ordered in UC Medications - No data to display  Initial Impression / Assessment and Plan / UC Course  I have reviewed the triage vital signs and the nursing notes.  Pertinent labs & imaging results that were available during my care of the patient were reviewed by me and considered in my medical decision making (see chart for details).   Patient is a pleasant 74-year-old female who presents for  evaluation of respiratory complaints going on for 3 weeks.  She is in no acute distress at present though is mildly tachypneic at 22 breaths/min.  She is satting 99% on room air.  Patient reports that she had an upper respiratory infection symptoms for last 3 weeks to include a runny nose, cough, and shortness of breath.  She reports that last week she had some sputum production when she coughed for a creamy yellow sputum that has since stopped.  She is also complaining of back pain across her upper shoulders.  Patient had a thoracentesis on 08/31/2020 and states that her current symptoms feel different than her symptoms at that time.  Patient has a history of chronic bilateral pleural effusions.  She is currently followed by Dr. Raul Del at [facility] clinic pulmonology.  Patient's physical exam reveals pearly gray tympanic membranes bilaterally with a normal light reflex and clear external auditory canals.  Nasal mucosa is very mildly erythematous and slightly edematous with scant clear nasal discharge.  Oropharyngeal exam is free of erythema or injection but there is mild postnasal drip present.  No cervical lymphadenopathy appreciated exam.  Cardiopulmonary exam reveals crackles in bilateral bases.  No wheezes appreciated on auscultation.  Will obtain chest x-ray.  Chest x-ray reviewed and independently  evaluated by me.  Interpretation: There are increased vascular markings in the right hilum.  There is also blunting of the costophrenic angles bilaterally. Awaiting radiology overread.  Radiology overread is negative for acute cardiopulmonary process.  I believe patient's cough symptoms are coming from her postnasal drip.  She indicates that she has not been using her Atrovent nasal spray or the Promethazine DM cough syrup as previously prescribed.  I have encouraged her to resume using both of those as previously directed.  She is currently on 40 mg of prednisone twice daily per pulmonology and she follows up  with pulmonology in 2 weeks.  I have encouraged patient to keep this appointment and to return for any new or worsening symptoms.  Final Clinical Impressions(s) / UC Diagnoses   Final diagnoses:  Cough     Discharge Instructions     Use the Atrovent nasal spray, 2 squirts in each nostril every 6 hours, as needed for runny nose and postnasal drip.  Continue your current prednisone dosing as prescribed by pulmonology.  Use the Promethazine DM cough syrup at bedtime for cough and congestion.  It will make you drowsy so do not take it during the day.  Return for reevaluation or see your primary care provider for any new or worsening symptoms.     ED Prescriptions    None     PDMP not reviewed this encounter.   Margarette Canada, NP 09/18/20 [phone]

## 2020-09-25 LAB — ACID FAST CULTURE WITH REFLEXED SENSITIVITIES (MYCOBACTERIA): Acid Fast Culture: NEGATIVE

## 2020-11-20 ENCOUNTER — Other Ambulatory Visit (HOSPITAL_COMMUNITY)
Admission: RE | Admit: 2020-11-20 | Discharge: 2020-11-20 | Disposition: A | Discharge status: Home / Self Care | Payer: No Typology Code available for payment source | Source: Ambulatory Visit | Attending: Obstetrics and Gynecology | Admitting: Obstetrics and Gynecology

## 2020-11-20 ENCOUNTER — Other Ambulatory Visit: Payer: Self-pay

## 2020-11-20 ENCOUNTER — Encounter: Payer: Self-pay | Admitting: Obstetrics and Gynecology

## 2020-11-20 ENCOUNTER — Ambulatory Visit (INDEPENDENT_AMBULATORY_CARE_PROVIDER_SITE_OTHER): Payer: No Typology Code available for payment source | Admitting: Obstetrics and Gynecology

## 2020-11-20 VITALS — BP 115/75 | HR 75 | Resp 16 | Ht 68.0 in | Wt 299.2 lb

## 2020-11-20 DIAGNOSIS — Z1159 Encounter for screening for other viral diseases: Secondary | ICD-10-CM

## 2020-11-20 DIAGNOSIS — Z124 Encounter for screening for malignant neoplasm of cervix: Secondary | ICD-10-CM

## 2020-11-20 DIAGNOSIS — Z114 Encounter for screening for human immunodeficiency virus [HIV]: Secondary | ICD-10-CM | POA: Diagnosis not present

## 2020-11-20 DIAGNOSIS — Z1231 Encounter for screening mammogram for malignant neoplasm of breast: Secondary | ICD-10-CM

## 2020-11-20 DIAGNOSIS — N904 Leukoplakia of vulva: Secondary | ICD-10-CM

## 2020-11-20 DIAGNOSIS — E039 Hypothyroidism, unspecified: Secondary | ICD-10-CM

## 2020-11-20 DIAGNOSIS — Z01419 Encounter for gynecological examination (general) (routine) without abnormal findings: Secondary | ICD-10-CM | POA: Diagnosis present

## 2020-11-20 DIAGNOSIS — Z6841 Body Mass Index (BMI) 40.0 and over, adult: Secondary | ICD-10-CM

## 2020-11-20 DIAGNOSIS — I10 Essential (primary) hypertension: Secondary | ICD-10-CM

## 2020-11-20 DIAGNOSIS — Z90711 Acquired absence of uterus with remaining cervical stump: Secondary | ICD-10-CM

## 2020-11-20 NOTE — Progress Notes (Signed)
GYNECOLOGY ANNUAL PHYSICAL EXAM PROGRESS NOTE  Subjective:    ZYARIA MANGIAPANE is a 60 y.o. G15P2002 female here for a routine annual gynecologic exam. She is overall doing well today and has no GYN concerns.  She denies post menopausal bleeding. Has the patient ever been transfused or tattooed?: no.The patient reports that there is no domestic violence.    She desires to mention the following today: She does report that she was recently diagnosed with lupus several months ago.  Notes that she had also been working to lose weight, lost ~ 40 lbs, but has regained most of it due to being put on steroids. Also had a pleural effusion requiring thoracentesis.   Gynecologic History No LMP recorded. Patient has had a hysterectomy. Menarche age: 79 Contraception: status post hysterectomy History of STI's: Denies Last Pap: Ordered: 07/09/2017. Results were: normal.  Denies h/o abnormal pap smears. H/o hysterectomy, but she retains cervix. Last mammogram: 02/09/2020. Results were: normal Last colonoscopy: 07/2017. Results were: sigmoid colon polyps (x 2), and perianal skin tags. Recommend repeat in 5 years (07/2022).   OB History  Gravida Para Term Preterm AB Living  '2 2 2 '$ 0 0 0  SAB IAB Ectopic Multiple Live Births  0 0 0 0 0    # Outcome Date GA Lbr Len/2nd Weight Sex Delivery Anes PTL Lv  2 Term 57    F Vag-Spont     1 Term 70    F Vag-Spont       Past Medical History:  Diagnosis Date   Depression    History of 2019 novel coronavirus disease (COVID-19) 12/21/2018   Hypertension    Menopause    Obese    Plantar fasciitis    Thyroid disease    Trigeminal neuralgia     Past Surgical History:  Procedure Laterality Date   ABDOMINAL HYSTERECTOMY     Retains cervix. [place]- DR [place]-D/T- HEAVY MENSES   CHOLECYSTECTOMY     COLONOSCOPY WITH PROPOFOL N/A 08/10/2017   Procedure: COLONOSCOPY WITH PROPOFOL;  Surgeon: Lin Landsman, MD;  Location: [facility] ENDOSCOPY;  Service:  Gastroenterology;  Laterality: N/A;   FACIAL SIALOCELE MARSUPILIZATION      Family History  Problem Relation Age of Onset   Breast cancer Mother 71   Coronary artery disease Father    Cancer Father        prostate   Heart disease Father    Gout Father     Social History   Socioeconomic History   Marital status: Married    Spouse name: Not on file   Number of children: Not on file   Years of education: Not on file   Highest education level: Not on file  Occupational History   Not on file  Tobacco Use   Smoking status: Former    Types: Cigarettes    Quit date: 04/21/1990    Years since quitting: 30.6   Smokeless tobacco: Never  Vaping Use   Vaping Use: Never used  Substance and Sexual Activity   Alcohol use: No   Drug use: No   Sexual activity: Not Currently    Birth control/protection: Surgical    Comment: HUSBAND VASECTOMY  Other Topics Concern   Not on file  Social History Narrative   Not on file   Social Determinants of Health   Financial Resource Strain: Not on file  Food Insecurity: Not on file  Transportation Needs: Not on file  Physical Activity: Not on file  Stress: Not on file  Social Connections: Not on file  Intimate Partner Violence: Not on file    Current Outpatient Medications on File Prior to Visit  Medication Sig Dispense Refill   aspirin EC 81 MG tablet Take 81 mg by mouth daily.     carbamazepine (TEGRETOL) 200 MG tablet Take 100 mg by mouth 3 (three) times daily.     citalopram (CELEXA) 40 MG tablet Take by mouth.     clobetasol ointment (TEMOVATE) 0.05 % Apply topically at bedtime as needed. 45 g 2   Cyanocobalamin (HM VITAMIN B-12 ULTRA STRENGTH) 5000 MCG TBDP Take by mouth.     gabapentin (NEURONTIN) 300 MG capsule Take 300 mg by mouth at bedtime.     hydroxychloroquine (PLAQUENIL) 200 MG tablet Take 1 tablet by mouth 2 (two) times daily.     ipratropium (ATROVENT) 0.06 % nasal spray Place 2 sprays into both nostrils 4 (four) times  daily as needed for rhinitis. 15 mL 0   levothyroxine (SYNTHROID, LEVOTHROID) 125 MCG tablet Take 125 mcg by mouth daily before breakfast.     lisinopril (PRINIVIL,ZESTRIL) 10 MG tablet Take 10 mg by mouth daily.     predniSONE (DELTASONE) 10 MG tablet 40 mg daily x 1 week, then 30 mg daily x 1 week, then 20 mg daily x 1 week, then 10 mg daily x 1 week, then 5 mg daily x 1 week. 74 tablet 0   promethazine-dextromethorphan (PROMETHAZINE-DM) 6.25-15 MG/5ML syrup Take 5 mLs by mouth 4 (four) times daily as needed for cough. 118 mL 0   No current facility-administered medications on file prior to visit.    Allergies  Allergen Reactions   Penicillins Rash and Hives     Review of Systems Constitutional: negative for chills, fatigue, fevers and sweats Eyes: negative for irritation, redness and visual disturbance Ears, nose, mouth, throat, and face: negative for hearing loss, nasal congestion, snoring and tinnitus Respiratory: negative for asthma, cough, sputum Cardiovascular: negative for chest pain, dyspnea, exertional chest pressure/discomfort, irregular heart beat, palpitations and syncope Gastrointestinal: negative for abdominal pain, change in bowel habits, nausea and vomiting Genitourinary: negative for abnormal menstrual periods, genital lesions, sexual problems and vaginal discharge, dysuria and urinary incontinence Integument/breast: negative for breast lump, breast tenderness and nipple discharge Hematologic/lymphatic: negative for bleeding and easy bruising Musculoskeletal:negative for back pain and muscle weakness Neurological: negative for dizziness, headaches, vertigo and weakness Endocrine: negative for diabetic symptoms including polydipsia, polyuria and skin dryness Allergic/Immunologic: negative for hay fever and urticaria      Objective:  Blood pressure 115/75, pulse 75, resp. rate 16, height '5\' 8"'$  (1.727 m), weight 299 lb 3.2 oz (135.7 kg).  Body mass index is 45.49  kg/m.   General Appearance:    Alert, cooperative, no distress, appears stated age, morbid obesity   Head:    Normocephalic, without obvious abnormality, atraumatic  Eyes:    PERRL, conjunctiva/corneas clear, EOM's intact, both eyes  Ears:    Normal external ear canals, both ears  Nose:   Nares normal, septum midline, mucosa normal, no drainage or sinus tenderness  Throat:   Lips, mucosa, and tongue normal; teeth and gums normal  Neck:   Supple, symmetrical, trachea midline, no adenopathy; thyroid: no enlargement/tenderness/nodules; no carotid bruit or JVD  Back:     Symmetric, no curvature, ROM normal, no CVA tenderness  Lungs:     Clear to auscultation bilaterally, respirations unlabored  Chest Wall:    No tenderness or deformity  Heart:    Regular rate and rhythm, S1 and S2 normal, no murmur, rub or gallop  Breast Exam:    No tenderness, masses, or nipple abnormality  Abdomen:     Soft, non-tender, bowel sounds active all four quadrants, no masses, no organomegaly.    Genitalia:    Pelvic:external genitalia normal, vagina without lesions, discharge, or tenderness, rectovaginal septum  normal. Cervix normal in appearance, no cervical motion tenderness, no adnexal masses or tenderness.  Uterus normal size, shape, mobile, regular contours, nontender.  Rectal:    Normal external sphincter.  No hemorrhoids appreciated. Internal exam not done.   Extremities:   Extremities normal, atraumatic, no cyanosis or edema  Pulses:   2+ and symmetric all extremities  Skin:   Skin color, texture, turgor normal, no rashes or lesions  Lymph nodes:   Cervical, supraclavicular, and axillary nodes normal  Neurologic:   CNII-XII intact, normal strength, sensation and reflexes throughout   .  Labs:  Lab Results  Component Value Date   WBC 6.3 07/09/2017   HGB 15.5 07/09/2017   HCT 45.0 07/09/2017   MCV 92 07/09/2017   PLT 223 07/09/2017    Lab Results  Component Value Date   CREATININE 0.83  07/09/2017   BUN 11 07/09/2017   NA 141 07/09/2017   K 5.1 07/09/2017   CL 101 07/09/2017   CO2 25 07/09/2017    Lab Results  Component Value Date   ALT 22 07/09/2017   AST 21 07/09/2017   ALKPHOS 59 07/09/2017   BILITOT 0.6 07/09/2017    Lab Results  Component Value Date   TSH 2.400 07/09/2017     Assessment:   1. Cervical cancer screening   2. Encounter for screening for HIV   3. Need for hepatitis C screening test   4. Well female exam with routine gynecological exam   5. History of hysterectomy, supracervical   6. Breast cancer screening by mammogram   7. Lichen sclerosus of female genitalia   8. Acquired hypothyroidism   9. Essential hypertension   10. Morbid obesity with BMI of 45.0-49.9, adult ([place])      Plan:  Blood tests: see orders. Notes she does not see her PCP regularly, usually mostly for problem visits. Breast self exam technique reviewed and patient encouraged to perform self-exam monthly. Contraception: status post hysterectomy and postmenopausal. Discussed healthy lifestyle modifications. Mammogram ordered Pap smear ordered. COVID vaccination status:  has completed vaccination series, eligible for booster.  Hypothyroidism managed on meds. Will check TSH.  Lichen sclerosis stable, no active flares recently. Use Clobetasol as needed.  Essential HTN managed with medications. Currently on Lisinopril. No issues with medication.  Follow up in 1 year for annual exam   Rubie Maid, MD [facility]

## 2020-11-20 NOTE — Patient Instructions (Signed)
Preventive Care 60-60 Years Old, Female Preventive care refers to lifestyle choices and visits with your health care provider that can promote health and wellness. This includes: A yearly physical exam. This is also called an annual wellness visit. Regular dental and eye exams. Immunizations. Screening for certain conditions. Healthy lifestyle choices, such as: Eating a healthy diet. Getting regular exercise. Not using drugs or products that contain nicotine and tobacco. Limiting alcohol use. What can I expect for my preventive care visit? Physical exam Your health care provider will check your: Height and weight. These may be used to calculate your BMI (body mass index). BMI is a measurement that tells if you are at a healthy weight. Heart rate and blood pressure. Body temperature. Skin for abnormal spots. Counseling Your health care provider may ask you questions about your: Past medical problems. Family's medical history. Alcohol, tobacco, and drug use. Emotional well-being. Home life and relationship well-being. Sexual activity. Diet, exercise, and sleep habits. Work and work Statistician. Access to firearms. Method of birth control. Menstrual cycle. Pregnancy history. What immunizations do I need?  Vaccines are usually given at various ages, according to a schedule. Your health care provider will recommend vaccines for you based on your age, medicalhistory, and lifestyle or other factors, such as travel or where you work. What tests do I need? Blood tests Lipid and cholesterol levels. These may be checked every 5 years, or more often if you are over 60 years old. Hepatitis C test. Hepatitis B test. Screening Lung cancer screening. You may have this screening every year starting at age 60 if you have a 30-pack-year history of smoking and currently smoke or have quit within the past 15 years. Colorectal cancer screening. All adults should have this screening starting at  age 60 and continuing until age 3. Your health care provider may recommend screening at age 60 if you are at increased risk. You will have tests every 1-10 years, depending on your results and the type of screening test. Diabetes screening. This is done by checking your blood sugar (glucose) after you have not eaten for a while (fasting). You may have this done every 1-3 years. Mammogram. This may be done every 1-2 years. Talk with your health care provider about when you should start having regular mammograms. This may depend on whether you have a family history of breast cancer. BRCA-related cancer screening. This may be done if you have a family history of breast, ovarian, tubal, or peritoneal cancers. Pelvic exam and Pap test. This may be done every 3 years starting at age 60. Starting at age 60, this may be done every 5 years if you have a Pap test in combination with an HPV test. Other tests STD (sexually transmitted disease) testing, if you are at risk. Bone density scan. This is done to screen for osteoporosis. You may have this scan if you are at high risk for osteoporosis. Talk with your health care provider about your test results, treatment options,and if necessary, the need for more tests. Follow these instructions at home: Eating and drinking  Eat a diet that includes fresh fruits and vegetables, whole grains, lean protein, and low-fat dairy products. Take vitamin and mineral supplements as recommended by your health care provider. Do not drink alcohol if: Your health care provider tells you not to drink. You are pregnant, may be pregnant, or are planning to become pregnant. If you drink alcohol: Limit how much you have to 0-1 drink a day. Be aware  of how much alcohol is in your drink. In the [place] one drink equals one 12 oz bottle of beer (355 mL), one 5 oz glass of wine (148 mL), or one 1 oz glass of hard liquor (44 mL).  Lifestyle Take daily care of your teeth and  gums. Brush your teeth every morning and night with fluoride toothpaste. Floss one time each day. Stay active. Exercise for at least 30 minutes 5 or more days each week. Do not use any products that contain nicotine or tobacco, such as cigarettes, e-cigarettes, and chewing tobacco. If you need help quitting, ask your health care provider. Do not use drugs. If you are sexually active, practice safe sex. Use a condom or other form of protection to prevent STIs (sexually transmitted infections). If you do not wish to become pregnant, use a form of birth control. If you plan to become pregnant, see your health care provider for a prepregnancy visit. If told by your health care provider, take low-dose aspirin daily starting at age 60. Find healthy ways to cope with stress, such as: Meditation, yoga, or listening to music. Journaling. Talking to a trusted person. Spending time with friends and family. Safety Always wear your seat belt while driving or riding in a vehicle. Do not drive: If you have been drinking alcohol. Do not ride with someone who has been drinking. When you are tired or distracted. While texting. Wear a helmet and other protective equipment during sports activities. If you have firearms in your house, make sure you follow all gun safety procedures. What's next? Visit your health care provider once a year for an annual wellness visit. Ask your health care provider how often you should have your eyes and teeth checked. Stay up to date on all vaccines. This information is not intended to replace advice given to you by your health care provider. Make sure you discuss any questions you have with your healthcare provider. Document Revised: 01/10/2020 Document Reviewed: 12/17/2017 Elsevier Patient Education  2022 [place] Breast self-awareness is knowing how your breasts look and feel. Doing breast self-awareness is important. It allows you to catch a  breast problem early while it is still small and can be treated. All women should do breast self-awareness, including women who have had breast implants. Tell your doctorif you notice a change in your breasts. What you need: A mirror. A well-lit room. How to do a breast self-exam A breast self-exam is one way to learn what is normal for your breasts and tocheck for changes. To do a breast self-exam: Look for changes  Take off all the clothes above your waist. Stand in front of a mirror in a room with good lighting. Put your hands on your hips. Push your hands down. Look at your breasts and nipples in the mirror to see if one breast or nipple looks different from the other. Check to see if: The shape of one breast is different. The size of one breast is different. There are wrinkles, dips, and bumps in one breast and not the other. Look at each breast for changes in the skin, such as: Redness. Scaly areas. Look for changes in your nipples, such as: Liquid around the nipples. Bleeding. Dimpling. Redness. A change in where the nipples are.  Feel for changes  Lie on your back on the floor. Feel each breast. To do this, follow these steps: Pick a breast to feel. Put the arm closest to that breast above your head.  Use your other arm to feel the nipple area of your breast. Feel the area with the pads of your three middle fingers by making small circles with your fingers. For the first circle, press lightly. For the second circle, press harder. For the third circle, press even harder. Keep making circles with your fingers at the different pressures as you move down your breast. Stop when you feel your ribs. Move your fingers a little toward the center of your body. Start making circles with your fingers again, this time going up until you reach your collarbone. Keep making up-and-down circles until you reach your armpit. Remember to keep using the three pressures. Feel the other breast  in the same way. Sit or stand in the tub or shower. With soapy water on your skin, feel each breast the same way you did in step 2 when you were lying on the floor.  Write down what you find Writing down what you find can help you remember what to tell your doctor. Write down: What is normal for each breast. Any changes you find in each breast, including: The kind of changes you find. Whether you have pain. Size and location of any lumps. When you last had your menstrual period. General tips Check your breasts every month. If you are breastfeeding, the best time to check your breasts is after you feed your baby or after you use a breast pump. If you get menstrual periods, the best time to check your breasts is 5-7 days after your menstrual period is over. With time, you will become comfortable with the self-exam, and you will begin to know if there are changes in your breasts. Contact a doctor if you: See a change in the shape or size of your breasts or nipples. See a change in the skin of your breast or nipples, such as red or scaly skin. Have fluid coming from your nipples that is not normal. Find a lump or thick area that was not there before. Have pain in your breasts. Have any concerns about your breast health. Summary Breast self-awareness includes looking for changes in your breasts, as well as feeling for changes within your breasts. Breast self-awareness should be done in front of a mirror in a well-lit room. You should check your breasts every month. If you get menstrual periods, the best time to check your breasts is 5-7 days after your menstrual period is over. Let your doctor know of any changes you see in your breasts, including changes in size, changes on the skin, pain or tenderness, or fluid from your nipples that is not normal. This information is not intended to replace advice given to you by your health care provider. Make sure you discuss any questions you have with  your healthcare provider. Document Revised: 11/24/2017 Document Reviewed: 11/24/2017 Elsevier Patient Education  [place].

## 2020-11-22 LAB — HIV ANTIBODY (ROUTINE TESTING W REFLEX): HIV Screen 4th Generation wRfx: NONREACTIVE

## 2020-11-22 LAB — LIPID PANEL
Chol/HDL Ratio: 2.9 ratio (ref 0.0–4.4)
Cholesterol, Total: 238 mg/dL — ABNORMAL HIGH (ref 100–199)
HDL: 83 mg/dL (ref >39)
LDL Chol Calc (NIH): 139 mg/dL — ABNORMAL HIGH (ref 0–99)
Triglycerides: 96 mg/dL (ref 0–149)
VLDL Cholesterol Cal: 16 mg/dL (ref 5–40)

## 2020-11-22 LAB — HEPATITIS C ANTIBODY: Hep C Virus Ab: <0.1 s/co ratio (ref 0.0–0.9)

## 2020-11-25 LAB — CYTOLOGY - PAP
Comment: NEGATIVE
Diagnosis: NEGATIVE
High risk HPV: NEGATIVE

## 2020-12-06 ENCOUNTER — Other Ambulatory Visit: Payer: Self-pay

## 2020-12-06 DIAGNOSIS — Z1231 Encounter for screening mammogram for malignant neoplasm of breast: Secondary | ICD-10-CM

## 2021-01-15 ENCOUNTER — Ambulatory Visit
Admission: EM | Admit: 2021-01-15 | Discharge: 2021-01-15 | Disposition: A | Discharge status: Home / Self Care | Payer: No Typology Code available for payment source | Attending: Emergency Medicine | Admitting: Emergency Medicine

## 2021-01-15 ENCOUNTER — Ambulatory Visit (INDEPENDENT_AMBULATORY_CARE_PROVIDER_SITE_OTHER): Payer: No Typology Code available for payment source

## 2021-01-15 DIAGNOSIS — M549 Dorsalgia, unspecified: Secondary | ICD-10-CM | POA: Diagnosis present

## 2021-01-15 DIAGNOSIS — R8290 Unspecified abnormal findings in urine: Secondary | ICD-10-CM | POA: Insufficient documentation

## 2021-01-15 DIAGNOSIS — R109 Unspecified abdominal pain: Secondary | ICD-10-CM

## 2021-01-15 LAB — URINALYSIS, COMPLETE (UACMP) WITH MICROSCOPIC
Bilirubin Urine: NEGATIVE
Glucose, UA: NEGATIVE mg/dL
Hgb urine dipstick: NEGATIVE
Ketones, ur: NEGATIVE mg/dL
Nitrite: NEGATIVE
Protein, ur: NEGATIVE mg/dL
Specific Gravity, Urine: 1.025 (ref 1.005–1.030)
pH: 5 (ref 5.0–8.0)

## 2021-01-15 LAB — CBC WITH DIFFERENTIAL/PLATELET
Abs Immature Granulocytes: 0.01 10*3/uL (ref 0.00–0.07)
Basophils Absolute: 0 10*3/uL (ref 0.0–0.1)
Basophils Relative: 0 %
Eosinophils Absolute: 0.1 10*3/uL (ref 0.0–0.5)
Eosinophils Relative: 3 %
HCT: 40.2 % (ref 36.0–46.0)
Hemoglobin: 13.8 g/dL (ref 12.0–15.0)
Immature Granulocytes: 0 %
Lymphocytes Relative: 30 %
Lymphs Abs: 1.2 10*3/uL (ref 0.7–4.0)
MCH: 33.6 pg (ref 26.0–34.0)
MCHC: 34.3 g/dL (ref 30.0–36.0)
MCV: 97.8 fL (ref 80.0–100.0)
Monocytes Absolute: 0.3 10*3/uL (ref 0.1–1.0)
Monocytes Relative: 7 %
Neutro Abs: 2.4 10*3/uL (ref 1.7–7.7)
Neutrophils Relative %: 60 %
Platelets: 202 10*3/uL (ref 150–400)
RBC: 4.11 MIL/uL (ref 3.87–5.11)
RDW: 12.9 % (ref 11.5–15.5)
WBC: 4 10*3/uL (ref 4.0–10.5)
nRBC: 0 % (ref 0.0–0.2)

## 2021-01-15 LAB — COMPREHENSIVE METABOLIC PANEL
ALT: 15 U/L (ref 0–44)
AST: 20 U/L (ref 15–41)
Albumin: 4 g/dL (ref 3.5–5.0)
Alkaline Phosphatase: 63 U/L (ref 38–126)
Anion gap: 8 (ref 5–15)
BUN: 19 mg/dL (ref 6–20)
CO2: 27 mmol/L (ref 22–32)
Calcium: 8.9 mg/dL (ref 8.9–10.3)
Chloride: 106 mmol/L (ref 98–111)
Creatinine, Ser: 1.05 mg/dL — ABNORMAL HIGH (ref 0.44–1.00)
GFR, Estimated: >60 mL/min (ref >60)
Glucose, Bld: 96 mg/dL (ref 70–99)
Potassium: 4.7 mmol/L (ref 3.5–5.1)
Sodium: 141 mmol/L (ref 135–145)
Total Bilirubin: 0.9 mg/dL (ref 0.3–1.2)
Total Protein: 6.8 g/dL (ref 6.5–8.1)

## 2021-01-15 MED ORDER — KETOROLAC TROMETHAMINE 10 MG PO TABS: 10.0000 mg | ORAL_TABLET | Freq: Four times a day (QID) | ORAL | 0 refills | Status: AC | PRN

## 2021-01-15 MED ORDER — KETOROLAC TROMETHAMINE 60 MG/2ML IM SOLN
60.0000 mg | Freq: Once | INTRAMUSCULAR | Status: AC
  Administered 2021-01-15: 60 mg via INTRAMUSCULAR

## 2021-01-15 NOTE — ED Triage Notes (Signed)
Pt here with C/O right sided flank pain stabbing, started yesterday, has got worst.

## 2021-01-15 NOTE — ED Provider Notes (Signed)
[facility]    CSN: [id number] Arrival date & time: 01/15/21  1407      History   Chief Complaint Chief Complaint  Patient presents with   Flank Pain    right    HPI Christine Moody is a 60 y.o. female presenting for severe squeezing intermittent right sided flank pain that began yesterday but has worsened today.  Twisting and forward flexion increase the pain.  Nothing has made the pain better but she admits to only trying Flexeril.  Pain does not radiate.  It is not associated with any fevers, fatigue, achiness, chest pain, breathing difficulty, cough, abdominal pain, nausea/vomiting or diarrhea.  She also does not report any urinary symptoms.  Denies any dysuria, frequency, hematuria or urgency.  Patient has a history of lupus and has recently had pleurisy.  Patient says the pain that she is experiencing now is different from that.  She had upper sharp back pain when she had pleurisy.  Patient denies any history of kidney stones.  Surgical history significant for abdominal hysterectomy and cholecystectomy.  No other complaints.  HPI  Past Medical History:  Diagnosis Date   Depression    History of 2019 novel coronavirus disease (COVID-19) 12/21/2018   Hypertension    Lupus (systemic lupus erythematosus) ([place]) 08/09/2020   Menopause    Obese    Plantar fasciitis    Thyroid disease    Trigeminal neuralgia     Patient Active Problem List   Diagnosis Date Noted   History of 2019 novel coronavirus disease (NIDPO-24) 23/53/6144   Lichen sclerosus of female genitalia 12/16/2018   Colon cancer screening    Stress incontinence 08/08/2015    Past Surgical History:  Procedure Laterality Date   ABDOMINAL HYSTERECTOMY     Retains cervix. [facility]- DR [place]-D/T- HEAVY MENSES   CHOLECYSTECTOMY     COLONOSCOPY WITH PROPOFOL N/A 08/10/2017   Procedure: COLONOSCOPY WITH PROPOFOL;  Surgeon: Lin Landsman, MD;  Location: [facility] ENDOSCOPY;  Service: Gastroenterology;   Laterality: N/A;   FACIAL SIALOCELE MARSUPILIZATION      OB History     Gravida  2   Para  2   Term  2   Preterm      AB      Living         SAB      IAB      Ectopic      Multiple      Live Births               Home Medications    Prior to Admission medications   Medication Sig Start Date End Date Taking? Authorizing Provider  aspirin EC 81 MG tablet Take 81 mg by mouth daily.   Yes [provider]  carbamazepine (TEGRETOL) 200 MG tablet Take 100 mg by mouth 3 (three) times daily.   Yes [provider]  cetirizine (ZYRTEC) 10 MG tablet Take by mouth.   Yes [provider]  citalopram (CELEXA) 40 MG tablet Take by mouth. 02/21/08  Yes [provider]  colchicine 0.6 MG tablet Take by mouth. 08/31/20  Yes [provider]  Cyanocobalamin (HM VITAMIN B-12 ULTRA STRENGTH) 5000 MCG TBDP Take by mouth.   Yes [provider]  cyclobenzaprine (FLEXERIL) 10 MG tablet Take 10 mg by mouth at bedtime.   Yes [provider]  EUTHYROX 137 MCG tablet Take 137 mcg by mouth daily. 10/06/20  Yes [provider]  folic  acid (FOLVITE) 1 MG tablet Take by mouth. 08/31/20 08/31/21 Yes [provider]  hydroxychloroquine (PLAQUENIL) 200 MG tablet Take 1 tablet by mouth 2 (two) times daily. 07/30/20  Yes [provider]  ketorolac (TORADOL) 10 MG tablet Take 1 tablet (10 mg total) by mouth every 6 (six) hours as needed. 01/15/21  Yes Danton Clap, PA-C  levothyroxine (SYNTHROID, LEVOTHROID) 125 MCG tablet Take 125 mcg by mouth daily before breakfast.   Yes [provider]  lisinopril (PRINIVIL,ZESTRIL) 10 MG tablet Take 10 mg by mouth daily.   Yes [provider]  methotrexate (RHEUMATREX) 2.5 MG tablet Take by mouth. 11/19/20  Yes [provider]  clobetasol ointment (TEMOVATE) 0.05 % Apply topically at bedtime as needed. 11/18/19   Rubie Maid, MD  predniSONE (DELTASONE) 10  MG tablet 40 mg daily x 1 week, then 30 mg daily x 1 week, then 20 mg daily x 1 week, then 10 mg daily x 1 week, then 5 mg daily x 1 week. 08/22/20   Coral Spikes, DO    Family History Family History  Problem Relation Age of Onset   Breast cancer Mother 74   Coronary artery disease Father    Cancer Father        prostate   Heart disease Father    Gout Father     Social History Social History   Tobacco Use   Smoking status: Former    Types: Cigarettes    Quit date: 04/21/1990    Years since quitting: 30.7   Smokeless tobacco: Never  Vaping Use   Vaping Use: Never used  Substance Use Topics   Alcohol use: No   Drug use: No     Allergies   Penicillins   Review of Systems Review of Systems  Constitutional:  Negative for fatigue and fever.  Respiratory:  Negative for cough, chest tightness and shortness of breath.   Cardiovascular:  Negative for chest pain.  Gastrointestinal:  Negative for abdominal pain, blood in stool, constipation, diarrhea, nausea and vomiting.  Genitourinary:  Positive for flank pain. Negative for dysuria, frequency and hematuria.  Musculoskeletal:  Positive for back pain. Negative for gait problem.  Skin:  Negative for rash.  Neurological:  Negative for weakness, numbness and headaches.    Physical Exam Triage Vital Signs ED Triage Vitals  Enc Vitals Group     BP 01/15/21 1443 129/77     Pulse Rate 01/15/21 1443 73     Resp 01/15/21 1443 16     Temp 01/15/21 1443 98.8 F (37.1 C)     Temp Source 01/15/21 1438 Oral     SpO2 01/15/21 1443 99 %     Weight 01/15/21 1440 300 lb (136.1 kg)     Height 01/15/21 1440 5\' 8"  (1.727 m)     Head Circumference --      Peak Flow --      Pain Score 01/15/21 1440 8     Pain Loc --      Pain Edu? --      Excl. in [place]? --    No data found.  Updated Vital Signs BP 129/77 (BP Location: Left Arm)   Pulse 73   Temp 98.8 F (37.1 C) (Oral)   Resp 16   Ht 5\' 8"  (1.727 m)   Wt 300 lb (136.1 kg)   SpO2  99%   BMI 45.61 kg/m      Physical Exam Vitals and nursing note reviewed.  Constitutional:      General: She is not in acute distress.    Appearance: Normal appearance. She is not ill-appearing or toxic-appearing.     Comments: Appears to be in pain, occasionally wincing in pain during triage/exam  HENT:     Head: Normocephalic and atraumatic.     Nose: Nose normal.     Mouth/Throat:     Mouth: Mucous membranes are moist.     Pharynx: Oropharynx is clear.  Eyes:     General: No scleral icterus.       Right eye: No discharge.        Left eye: No discharge.     Conjunctiva/sclera: Conjunctivae normal.  Cardiovascular:     Rate and Rhythm: Normal rate and regular rhythm.     Heart sounds: Normal heart sounds.  Pulmonary:     Effort: Pulmonary effort is normal. No respiratory distress.     Breath sounds: Normal breath sounds.  Abdominal:     Palpations: Abdomen is soft.     Tenderness: There is no abdominal tenderness. There is right CVA tenderness. There is no left CVA tenderness.  Musculoskeletal:     Cervical back: Neck supple.  Skin:    General: Skin is dry.  Neurological:     General: No focal deficit present.     Mental Status: She is alert. Mental status is at baseline.     Motor: No weakness.     Gait: Gait normal.  Psychiatric:        Mood and Affect: Mood normal.        Behavior: Behavior normal.        Thought Content: Thought content normal.     UC Treatments / Results  Labs (all labs ordered are listed, but only abnormal results are displayed) Labs Reviewed  URINE CULTURE - Abnormal; Notable for the following components:      Result Value   Culture MULTIPLE SPECIES PRESENT, SUGGEST RECOLLECTION (*)    All other components within normal limits  URINALYSIS, COMPLETE (UACMP) WITH MICROSCOPIC - Abnormal; Notable for the following components:   Leukocytes,Ua SMALL (*)    Bacteria, UA MANY (*)    All other components within normal limits  COMPREHENSIVE  METABOLIC PANEL - Abnormal; Notable for the following components:   Creatinine, Ser 1.05 (*)    All other components within normal limits  CBC WITH DIFFERENTIAL/PLATELET    EKG   Radiology No results found.  Procedures Procedures (including critical care time)  Medications Ordered in UC Medications  ketorolac (TORADOL) injection 60 mg (60 mg Intramuscular Given 01/15/21 1518)    Initial Impression / Assessment and Plan / UC Course  I have reviewed the triage vital signs and the nursing notes.  Pertinent labs & imaging results that were available during my care of the patient were reviewed by me and considered in my medical decision making (see chart for details).  60 year old female presenting for right flank pain for the past day.  Pain worse with movement.  Nothing is helped the pain.  Vitals all normal and stable today.  Patient is well-appearing but does appear to be in pain.  Exam significant for right-sided CVA tenderness.  No abdominal tenderness.  Chest is clear to auscultation and heart regular rate and rhythm.  Urinalysis obtained today as well as KUB to assess for possible renal stone.  CBC, CMP also obtained.  Patient given 60 mg IM ketorolac in clinic for pain.  UA shows small leukocytes.  Will send urine for culture, but she has no urinary symptoms other than the flank pain. Also she is afebrile.  Patient would like to hold off on antibiotics at this time.  CBC shows:  normal CMP shows: slightly increased Cr at 1.05  KUB independently reviewed by me. Over read shows-negative study  Pain likely more musculoskeletal in origin. She does state that she has had near total relief of pain after ketorolac injection.  Reviewed supportive care. Advised increasing fluids and rest. Ketorolac and Tylenol for mild/moderate pain.   Reviewed ED precautions and advised going to ED for fever, increased pain, feeling ill, loss of bowel bladder control. F/u with PCP if not feeling  better in a few days or ED sooner if feeling worse.   Final Clinical Impressions(s) / UC Diagnoses   Final diagnoses:  Right flank pain  Abnormal urinalysis  Acute right-sided back pain, unspecified back location     Discharge Instructions      -Your x-ray is normal.  The urine does show some white blood cells and there are but you have decided to hold off on any antibiotics until the culture results.  That takes about 2 days but if you start to have urinary symptoms, worse pain or fever please call or go to emergency department for reevaluation. -It is great that you had a lot of relief with the Toradol injection.  I have sent that an oral form to the pharmacy for you.  You can take Tylenol with this if you need to.  Also consider using heating pad and lidocaine patches.  Your pain is likely musculoskeletal but if any symptoms worsen you should be seen again. -As I discussed I will contact you if any of your labs are abnormal.     ED Prescriptions     Medication Sig Dispense Auth. Provider   ketorolac (TORADOL) 10 MG tablet Take 1 tablet (10 mg total) by mouth every 6 (six) hours as needed. 20 tablet Gretta Cool      PDMP not reviewed this encounter.   Danton Clap, PA-C 01/18/21 [phone]

## 2021-01-15 NOTE — Discharge Instructions (Signed)
-  Your x-ray is normal.  The urine does show some white blood cells and there are but you have decided to hold off on any antibiotics until the culture results.  That takes about 2 days but if you start to have urinary symptoms, worse pain or fever please call or go to emergency department for reevaluation. -It is great that you had a lot of relief with the Toradol injection.  I have sent that an oral form to the pharmacy for you.  You can take Tylenol with this if you need to.  Also consider using heating pad and lidocaine patches.  Your pain is likely musculoskeletal but if any symptoms worsen you should be seen again. -As I discussed I will contact you if any of your labs are abnormal.

## 2021-01-16 LAB — URINE CULTURE

## 2021-02-12 ENCOUNTER — Ambulatory Visit
Admission: RE | Admit: 2021-02-12 | Discharge: 2021-02-12 | Disposition: A | Discharge status: Home / Self Care | Payer: No Typology Code available for payment source | Source: Ambulatory Visit | Attending: Obstetrics and Gynecology | Admitting: Obstetrics and Gynecology

## 2021-02-12 ENCOUNTER — Other Ambulatory Visit: Payer: Self-pay

## 2021-02-12 DIAGNOSIS — Z1231 Encounter for screening mammogram for malignant neoplasm of breast: Secondary | ICD-10-CM | POA: Diagnosis not present

## 2021-04-27 ENCOUNTER — Other Ambulatory Visit: Payer: Self-pay

## 2021-04-27 ENCOUNTER — Ambulatory Visit
Admission: RE | Admit: 2021-04-27 | Discharge: 2021-04-27 | Disposition: A | Discharge status: Home / Self Care | Payer: No Typology Code available for payment source | Source: Ambulatory Visit

## 2021-04-27 VITALS — BP 136/86 | HR 91 | Temp 99.0°F | Resp 14 | Ht 68.0 in | Wt 300.0 lb

## 2021-04-27 DIAGNOSIS — H5789 Other specified disorders of eye and adnexa: Secondary | ICD-10-CM

## 2021-04-27 DIAGNOSIS — B029 Zoster without complications: Secondary | ICD-10-CM | POA: Diagnosis not present

## 2021-04-27 MED ORDER — VALACYCLOVIR HCL 1 G PO TABS: 1000.0000 mg | ORAL_TABLET | Freq: Three times a day (TID) | ORAL | 0 refills | Status: AC

## 2021-04-27 NOTE — Discharge Instructions (Signed)
-  Your rash is consistent with shingles.  I have sent antiviral medicine to the pharmacy to the rash from spreading. - It is in a dermatome that can affect your eye.  Contact the [facility] early this week so they can see you.  If they cannot see you in the next few days, contact another [place] can ice your eyes to help with the swelling. - Tylenol for pain and discomfort. - Go to ER if you have any vision changes, significant increased swelling or pain.  [facility] Address: [address], [place], [place] [address] Phone: [phone]

## 2021-04-27 NOTE — ED Triage Notes (Signed)
Patient started Doxycycline on Thursday.  Patient reports redness and swelling in her upper left eyelid that started this morning.  Patient reports red tender rash on the left side of her face that started this morning.  Patient denies fevers.

## 2021-04-27 NOTE — ED Provider Notes (Signed)
[facility]    CSN: [id number] Arrival date & time: 04/27/21  1050      History   Chief Complaint Chief Complaint  Patient presents with   Appointment   Eye Problem    left    HPI Christine Moody is a 61 y.o. female presenting for painful vesicular rash of face and eyelid swelling and redness since yesterday.  Denies any associated fevers.  Reports that she started taking doxycycline 2 days ago for suspected sinus infection.  Reports she recently had a cold and developed a swollen lymph node of the left side of her neck.  Patient reports her PCP thought her eardrum looked red so they put her on antibiotics.  She denies any sinus pain and says congestion and cough have improved.  No breathing problem.  Has taken doxycycline in the past without any issues.  Denies any vision changes, headaches or dizziness.  No other complaints.  HPI  Past Medical History:  Diagnosis Date   Depression    History of 2019 novel coronavirus disease (COVID-19) 12/21/2018   Hypertension    Lupus (systemic lupus erythematosus) ([place]) 08/09/2020   Menopause    Obese    Plantar fasciitis    Thyroid disease    Trigeminal neuralgia     Patient Active Problem List   Diagnosis Date Noted   History of 2019 novel coronavirus disease ([facility]-62) 13/11/6576   Lichen sclerosus of female genitalia 12/16/2018   Colon cancer screening    Stress incontinence 08/08/2015    Past Surgical History:  Procedure Laterality Date   ABDOMINAL HYSTERECTOMY     Retains cervix. [place]- DR [place]-D/T- HEAVY MENSES   CHOLECYSTECTOMY     COLONOSCOPY WITH PROPOFOL N/A 08/10/2017   Procedure: COLONOSCOPY WITH PROPOFOL;  Surgeon: Lin Landsman, MD;  Location: [facility] ENDOSCOPY;  Service: Gastroenterology;  Laterality: N/A;   FACIAL SIALOCELE MARSUPILIZATION      OB History     Gravida  2   Para  2   Term  2   Preterm      AB      Living         SAB      IAB      Ectopic      Multiple       Live Births               Home Medications    Prior to Admission medications   Medication Sig Start Date End Date Taking? Authorizing Provider  aspirin EC 81 MG tablet Take 81 mg by mouth daily.   Yes [provider]  carbamazepine (TEGRETOL) 200 MG tablet Take 100 mg by mouth 3 (three) times daily.   Yes [provider]  cetirizine (ZYRTEC) 10 MG tablet Take by mouth.   Yes [provider]  citalopram (CELEXA) 40 MG tablet Take by mouth. 02/21/08  Yes [provider]  Cyanocobalamin (HM VITAMIN B-12 ULTRA STRENGTH) 5000 MCG TBDP Take by mouth.   Yes [provider]  cyclobenzaprine (FLEXERIL) 10 MG tablet Take 10 mg by mouth at bedtime.   Yes [provider]  EUTHYROX 137 MCG tablet Take 137 mcg by mouth daily. 10/06/20  Yes [provider]  folic acid (FOLVITE) 1 MG tablet Take by mouth. 08/31/20 08/31/21 Yes [provider]  hydroxychloroquine (PLAQUENIL) 200 MG tablet Take 1 tablet by mouth 2 (two) times daily. 07/30/20  Yes [provider]  lisinopril (PRINIVIL,ZESTRIL) 10 MG tablet  Take 10 mg by mouth daily.   Yes [provider]  methotrexate (RHEUMATREX) 2.5 MG tablet Take by mouth. 11/19/20  Yes [provider]  valACYclovir (VALTREX) 1000 MG tablet Take 1 tablet (1,000 mg total) by mouth 3 (three) times daily for 7 days. 04/27/21 05/04/21 Yes Laurene Footman B, PA-C  clobetasol ointment (TEMOVATE) 0.05 % Apply topically at bedtime as needed. 11/18/19   Rubie Maid, MD  colchicine 0.6 MG tablet Take by mouth. 08/31/20   [provider]  doxycycline (VIBRAMYCIN) 100 MG capsule Take 100 mg by mouth 2 (two) times daily. 04/25/21   [provider]  ketorolac (TORADOL) 10 MG tablet Take 1 tablet (10 mg total) by mouth every 6 (six) hours as needed. 01/15/21   Danton Clap, PA-C  levothyroxine (SYNTHROID, LEVOTHROID) 125 MCG tablet Take 125 mcg by mouth daily before breakfast.     [provider]  predniSONE (DELTASONE) 10 MG tablet 40 mg daily x 1 week, then 30 mg daily x 1 week, then 20 mg daily x 1 week, then 10 mg daily x 1 week, then 5 mg daily x 1 week. 08/22/20   Coral Spikes, DO    Family History Family History  Problem Relation Age of Onset   Breast cancer Mother 22   Coronary artery disease Father    Cancer Father        prostate   Heart disease Father    Gout Father     Social History Social History   Tobacco Use   Smoking status: Former    Types: Cigarettes    Quit date: 04/21/1990    Years since quitting: 31.0   Smokeless tobacco: Never  Vaping Use   Vaping Use: Never used  Substance Use Topics   Alcohol use: No   Drug use: No     Allergies   Penicillins   Review of Systems Review of Systems  Constitutional:  Positive for fatigue. Negative for fever.  HENT:  Negative for congestion and sinus pain.   Respiratory:  Negative for cough and shortness of breath.   Cardiovascular:  Negative for chest pain.  Musculoskeletal:  Negative for arthralgias and myalgias.  Skin:  Positive for rash.  Neurological:  Negative for dizziness, weakness and headaches.  Hematological:  Positive for adenopathy.    Physical Exam Triage Vital Signs ED Triage Vitals  Enc Vitals Group     BP 04/27/21 1106 136/86     Pulse Rate 04/27/21 1106 91     Resp 04/27/21 1106 14     Temp 04/27/21 1106 99 F (37.2 C)     Temp Source 04/27/21 1106 Oral     SpO2 04/27/21 1106 96 %     Weight 04/27/21 1103 300 lb (136.1 kg)     Height 04/27/21 1103 5\' 8"  (1.727 m)     Head Circumference --      Peak Flow --      Pain Score 04/27/21 1103 7     Pain Loc --      Pain Edu? --      Excl. in [place]? --    No data found.  Updated Vital Signs BP 136/86 (BP Location: Left Arm)    Pulse 91    Temp 99 F (37.2 C) (Oral)    Resp 14    Ht 5\' 8"  (1.727 m)    Wt 300 lb (136.1 kg)    SpO2 96%    BMI 45.61 kg/m  Visual Acuity Right Eye Distance: 20/25  corrected Left Eye Distance: 20/20 corrected Bilateral Distance: 20/25 corrected  Right Eye Near:   Left Eye Near:    Bilateral Near:     Physical Exam Vitals and nursing note reviewed.  Constitutional:      General: She is not in acute distress.    Appearance: Normal appearance. She is not ill-appearing or toxic-appearing.  HENT:     Head: Normocephalic and atraumatic.     Right Ear: Tympanic membrane, ear canal and external ear normal.     Left Ear: Tympanic membrane, ear canal and external ear normal.     Nose: Nose normal.     Mouth/Throat:     Mouth: Mucous membranes are moist.     Pharynx: Oropharynx is clear.  Eyes:     General: No scleral icterus.       Right eye: No discharge.        Left eye: No discharge.     Conjunctiva/sclera: Conjunctivae normal.     Comments: Fluorescein eye stain is normal.  Cardiovascular:     Rate and Rhythm: Normal rate and regular rhythm.     Heart sounds: Normal heart sounds.  Pulmonary:     Effort: Pulmonary effort is normal. No respiratory distress.     Breath sounds: Normal breath sounds.  Musculoskeletal:     Cervical back: Neck supple.  Lymphadenopathy:     Cervical: Cervical adenopathy present.  Skin:    General: Skin is dry.     Findings: Rash present.     Comments: See image below.  Patient has vesicular rash on erythematous base affecting the V1 dermatome.  Also patient has moderate swelling of the left upper eyelid.  Neurological:     General: No focal deficit present.     Mental Status: She is alert. Mental status is at baseline.     Motor: No weakness.     Gait: Gait normal.  Psychiatric:        Mood and Affect: Mood normal.        Behavior: Behavior normal.        Thought Content: Thought content normal.      UC Treatments / Results  Labs (all labs ordered are listed, but only abnormal results are displayed) Labs Reviewed - No data to display  EKG   Radiology No results found.  Procedures Procedures  (including critical care time)  Medications Ordered in UC Medications - No data to display  Initial Impression / Assessment and Plan / UC Course  I have reviewed the triage vital signs and the nursing notes.  Pertinent labs & imaging results that were available during my care of the patient were reviewed by me and considered in my medical decision making (see chart for details).  61 year old female presenting for rash that is vesicular and painful of left side of face and swollen and painful left upper eyelid.  Vitals normal and stable.  Images included in chart of patient's rash.  Tender and swollen lymph nodes of the left anterior cervical region.  Vesicular rash on erythematous base affecting the V1 dermatome.  Swelling and erythema of the left upper eyelid.  Normal fluorescein stain.  Visual acuity intact.  The remainder the exam is normal.  Clinical presentation consistent with herpes zoster/shingles.  Sent in Valtrex to pharmacy.  Encouraged Tylenol for pain, rest and fluids.  Advised discontinuing doxycycline as symptoms are not consistent with a sinus infection.  Advised patient to contact  [facility] first thing on Monday for appointment.  Reviewed going to ED sooner for any acute worsening symptoms, especially if her vision worsens or she has any severe headaches or fevers.   Final Clinical Impressions(s) / UC Diagnoses   Final diagnoses:  Herpes zoster without complication  Eye swelling, left     Discharge Instructions      -Your rash is consistent with shingles.  I have sent antiviral medicine to the pharmacy to the rash from spreading. - It is in a dermatome that can affect your eye.  Contact the [facility] early this week so they can see you.  If they cannot see you in the next few days, contact another [place] can ice your eyes to help with the swelling. - Tylenol for pain and discomfort. - Go to ER if you have any vision changes, significant increased  swelling or pain.  [facility] Address: [address] [place], [place] [address] Phone: [phone]     ED Prescriptions     Medication Sig Dispense Auth. Provider   valACYclovir (VALTREX) 1000 MG tablet Take 1 tablet (1,000 mg total) by mouth 3 (three) times daily for 7 days. 21 tablet Danton Clap, PA-C      PDMP not reviewed this encounter.   Danton Clap, PA-C 04/27/21 1300

## 2022-02-06 DIAGNOSIS — L930 Discoid lupus erythematosus: Secondary | ICD-10-CM | POA: Insufficient documentation

## 2022-02-06 DIAGNOSIS — Z6841 Body Mass Index (BMI) 40.0 and over, adult: Secondary | ICD-10-CM | POA: Insufficient documentation

## 2022-05-08 IMAGING — DX DG CHEST 1V PORT
1 series · 1 of 1 positions shown · non-contrast
Comparison: 08/06/2020

CLINICAL DATA: Status post left thoracentesis.

EXAM:
PORTABLE CHEST 1 VIEW

[chest ap]
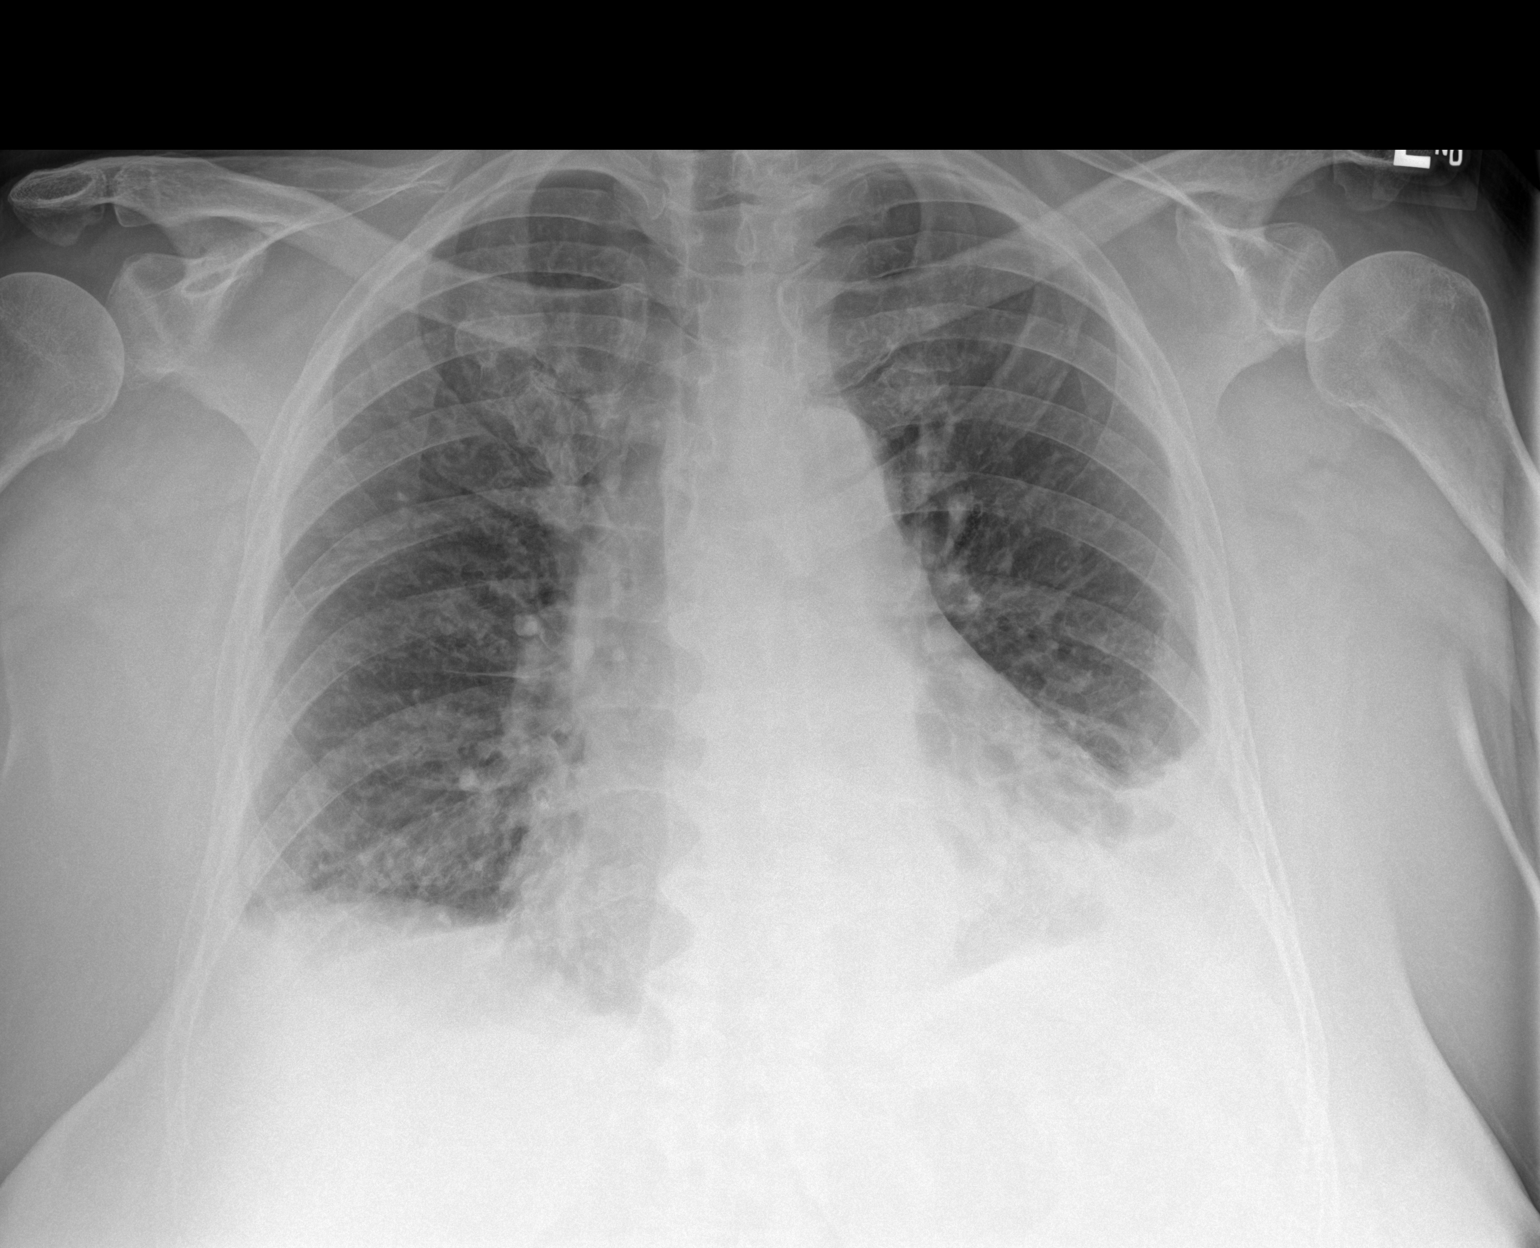

[1 of 1 positions shown; findings below may reference images not displayed]

FINDINGS: Stable heart size. No pneumothorax after left thoracentesis. Some
residual opacity remains present at the left lateral lung base
likely consistent with some residual loculated fluid. Lungs
demonstrate some interstitial and pulmonary vascular prominence
which may be consistent with some degree of underlying interstitial
edema.
IMPRESSION: No pneumothorax after left thoracentesis. Residual opacity at the
left lung base likely represents some residual loculated fluid.
Underlying interstitial edema is suspected.

## 2022-11-29 ENCOUNTER — Other Ambulatory Visit: Payer: Self-pay

## 2022-11-29 ENCOUNTER — Emergency Department: Payer: PRIVATE HEALTH INSURANCE

## 2022-11-29 ENCOUNTER — Emergency Department
Admission: EM | Admit: 2022-11-29 | Discharge: 2022-11-29 | Disposition: A | Discharge status: Home / Self Care | Payer: PRIVATE HEALTH INSURANCE | Attending: Emergency Medicine | Admitting: Emergency Medicine

## 2022-11-29 DIAGNOSIS — Z7982 Long term (current) use of aspirin: Secondary | ICD-10-CM | POA: Diagnosis not present

## 2022-11-29 DIAGNOSIS — I10 Essential (primary) hypertension: Secondary | ICD-10-CM | POA: Insufficient documentation

## 2022-11-29 DIAGNOSIS — W19XXXA Unspecified fall, initial encounter: Secondary | ICD-10-CM

## 2022-11-29 DIAGNOSIS — S022XXA Fracture of nasal bones, initial encounter for closed fracture: Secondary | ICD-10-CM | POA: Diagnosis not present

## 2022-11-29 DIAGNOSIS — W01198A Fall on same level from slipping, tripping and stumbling with subsequent striking against other object, initial encounter: Secondary | ICD-10-CM | POA: Insufficient documentation

## 2022-11-29 DIAGNOSIS — S0992XA Unspecified injury of nose, initial encounter: Secondary | ICD-10-CM | POA: Diagnosis present

## 2022-11-29 MED ORDER — OXYMETAZOLINE HCL 0.05 % NA SOLN: 2.0000 | Freq: Two times a day (BID) | NASAL | 0 refills | Status: AC | PRN
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
## 2022-11-29 NOTE — ED Triage Notes (Addendum)
NO LOC, no blood thinners besides baby aspirin. Pt AOX4, abrasion to nose and swollen upper lip noted. Pt unsure of last tetanus shot.

## 2022-11-29 NOTE — ED Provider Notes (Signed)
[facility] Provider Note  Patient Contact: 9:20 PM (approximate)   History   Fall   HPI  Christine Moody is a 62 y.o. female who presents the emergency department complaining of a fall.  Patient had a mechanical fall where she tripped, fell forward.  She tried to get herself with her outstretched hands, did injure the right wrist but not the left.  No knees, back, hip pain but patient did hit her face.  No loss of consciousness.  She had a nosebleed and has had swelling and pain to the nasal bridge.  No subsequent loss of consciousness.  No vision changes.  No neck pain.     Physical Exam   Triage Vital Signs: ED Triage Vitals  Encounter Vitals Group     BP 11/29/22 1753 122/63     Systolic BP Percentile --      Diastolic BP Percentile --      Pulse Rate 11/29/22 1753 77     Resp 11/29/22 1753 18     Temp 11/29/22 1753 98.7 F (37.1 C)     Temp Source 11/29/22 1753 Oral     SpO2 11/29/22 1753 95 %     Weight --      Height --      Head Circumference --      Peak Flow --      Pain Score 11/29/22 1752 8     Pain Loc --      Pain Education --      Exclude from Growth Chart --     Most recent vital signs: Vitals:   11/29/22 1753 11/29/22 1757  BP: 122/63 122/63  Pulse: 77 72  Resp: 18 19  Temp: 98.7 F (37.1 C) 98.7 F (37.1 C)  SpO2: 95% 98%     General: Alert and in no acute distress. Head: Edema and abrasion to the nose, the patient has dried blood the nares but no active epistaxis.  No other visible signs of trauma with periorbital ecchymosis, battle signs, serosanguineous fluid drainage from the ears or nares.  Neck: No stridor. No cervical spine tenderness to palpation  Cardiovascular:  Good peripheral perfusion Respiratory: Normal respiratory effort without tachypnea or retractions. Lungs CTAB. Musculoskeletal: Full range of motion to all extremities.  Visualization of the right wrist reveals bruising and slight edema but no  open wounds.  Good range of motion preserved.  Sensation intact all digits.  Cap refill intact all digits. Neurologic:  No gross focal neurologic deficits are appreciated.  Skin:   No rash noted Other:   ED Results / Procedures / Treatments   Labs (all labs ordered are listed, but only abnormal results are displayed) Labs Reviewed - No data to display   EKG     RADIOLOGY  I personally viewed, evaluated, and interpreted these images as part of my medical decision making, as well as reviewing the written report by the radiologist.  ED Provider Interpretation: Patient with negative x-ray.  CT scan revealed minimally displaced nasal bone fractures without other acute abnormality.  DG Facial Bones Complete  Result Date: 11/29/2022 CLINICAL DATA:  Fall, nasal swelling EXAM: FACIAL BONES COMPLETE 3+V COMPARISON:  None Available. FINDINGS: There is no evidence of fracture or other significant bone abnormality. No orbital emphysema or sinus air-fluid levels are seen. IMPRESSION: Negative. Electronically Signed   By: Charline Bills M.D.   On: 11/29/2022 18:27    PROCEDURES:  Critical Care performed: No  Procedures  MEDICATIONS ORDERED IN ED: Medications - No data to display   IMPRESSION / MDM / ASSESSMENT AND PLAN / ED COURSE  I reviewed the triage vital signs and the nursing notes.                                 Differential diagnosis includes, but is not limited to, fall, facial fracture, intracranial hemorrhage, cervical spine injury   Patient's presentation is most consistent with acute presentation with potential threat to life or bodily function.   Patient's diagnosis is consistent with nasal bone fracture, fall.  Patient presents emergency department after tripping and falling and hitting her face.  Patient and swelling to the nose and period of epistaxis that was controlled with direct pressure.  Patient denies loss of consciousness.  Imaging reveals no skull  fracture, intracranial hemorrhage, cervical spine injury but does have newly displaced bilateral nasal bone fractures.  At this time patient will medications for any return of of nasal bleeding.  Follow-up primary care as needed.  Return precautions discussed with the patient..  Patient is given ED precautions to return to the ED for any worsening or new symptoms.     FINAL CLINICAL IMPRESSION(S) / ED DIAGNOSES   Final diagnoses:  Fall, initial encounter  Closed fracture of nasal bone, initial encounter     Rx / DC Orders   ED Discharge Orders          Ordered    oxymetazoline (AFRIN) 0.05 % nasal spray  2 times daily PRN        11/29/22 2313             Note:  This document was prepared using Dragon voice recognition software and may include unintentional dictation errors.   Lanette Hampshire 11/29/22 2313    Sharyn Creamer, MD 11/30/22 1425

## 2023-05-18 DIAGNOSIS — R5383 Other fatigue: Secondary | ICD-10-CM | POA: Insufficient documentation

## 2023-05-27 ENCOUNTER — Other Ambulatory Visit: Payer: Self-pay | Admitting: Family Medicine

## 2023-05-27 DIAGNOSIS — Z1231 Encounter for screening mammogram for malignant neoplasm of breast: Secondary | ICD-10-CM

## 2023-06-27 ENCOUNTER — Ambulatory Visit
Admission: EM | Admit: 2023-06-27 | Discharge: 2023-06-27 | Disposition: A | Discharge status: Home / Self Care | Attending: Emergency Medicine | Admitting: Emergency Medicine

## 2023-06-27 ENCOUNTER — Ambulatory Visit (INDEPENDENT_AMBULATORY_CARE_PROVIDER_SITE_OTHER)

## 2023-06-27 DIAGNOSIS — M25572 Pain in left ankle and joints of left foot: Secondary | ICD-10-CM

## 2023-06-27 DIAGNOSIS — S82832A Other fracture of upper and lower end of left fibula, initial encounter for closed fracture: Secondary | ICD-10-CM

## 2023-06-27 MED ORDER — HYDROCODONE-ACETAMINOPHEN 5-325 MG PO TABS: 1.0000 | ORAL_TABLET | Freq: Four times a day (QID) | ORAL | 0 refills | Status: AC | PRN
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
## 2023-12-24 ENCOUNTER — Ambulatory Visit
Admission: RE | Admit: 2023-12-24 | Discharge: 2023-12-24 | Disposition: A | Discharge status: Home / Self Care | Source: Ambulatory Visit | Attending: Family Medicine | Admitting: Family Medicine

## 2023-12-24 DIAGNOSIS — Z1231 Encounter for screening mammogram for malignant neoplasm of breast: Secondary | ICD-10-CM | POA: Insufficient documentation

## 2023-12-30 ENCOUNTER — Other Ambulatory Visit: Payer: Self-pay | Admitting: Medical Genetics

## 2024-01-04 ENCOUNTER — Other Ambulatory Visit
Admission: RE | Admit: 2024-01-04 | Discharge: 2024-01-04 | Disposition: A | Discharge status: Home / Self Care | Payer: Self-pay | Source: Ambulatory Visit | Attending: Medical Genetics | Admitting: Medical Genetics

## 2024-01-12 LAB — GENECONNECT MOLECULAR SCREEN: Genetic Analysis Overall Interpretation: NEGATIVE

## 2024-02-29 DIAGNOSIS — R0609 Other forms of dyspnea: Secondary | ICD-10-CM | POA: Insufficient documentation

## 2024-03-07 ENCOUNTER — Telehealth: Payer: Self-pay

## 2024-03-07 ENCOUNTER — Other Ambulatory Visit: Payer: Self-pay

## 2024-03-07 DIAGNOSIS — Z860100 Personal history of colon polyps, unspecified: Secondary | ICD-10-CM

## 2024-03-07 DIAGNOSIS — Z1211 Encounter for screening for malignant neoplasm of colon: Secondary | ICD-10-CM

## 2024-03-07 MED ORDER — NA SULFATE-K SULFATE-MG SULF 17.5-3.13-1.6 GM/177ML PO SOLN: 1.0000 | Freq: Once | ORAL | 0 refills | Status: AC

## 2024-03-07 NOTE — Telephone Encounter (Signed)
 Gastroenterology Pre-Procedure Review  Request Date: 05/31/24 Requesting Physician: Dr. Jinny  PATIENT REVIEW QUESTIONS: The patient responded to the following health history questions as indicated:    1. Are you having any GI issues? no 2. Do you have a personal history of Polyps? yes (last colonoscopy performed by Dr. Unk 08/10/2017 recommended repeat in 5 years) 3. Do you have a family history of Colon Cancer or Polyps? no 4. Diabetes Mellitus? no 5. Joint replacements in the past 12 months?no 6. Major health problems in the past 3 months?since last colonoscopy patient diagnosed with Lupus 7. Any artificial heart valves, MVP, or defibrillator?no 8. Pulmonary Clearance    MEDICATIONS & ALLERGIES:    Patient reports the following regarding taking any anticoagulation/antiplatelet therapy:   Plavix, Coumadin, Eliquis, Xarelto, Lovenox, Pradaxa, Brilinta, or Effient? no Aspirin? yes (81 mg daily)  Patient confirms/reports the following medications:  Current Outpatient Medications  Medication Sig Dispense Refill   Na Sulfate-K Sulfate-Mg Sulfate concentrate (SUPREP) 17.5-3.13-1.6 GM/177ML SOLN Take 1 kit (354 mLs total) by mouth once for 1 dose. 354 mL 0   aspirin EC 81 MG tablet Take 81 mg by mouth daily.     carbamazepine (TEGRETOL) 200 MG tablet Take 100 mg by mouth 3 (three) times daily.     cetirizine (ZYRTEC) 10 MG tablet Take by mouth.     citalopram (CELEXA) 40 MG tablet Take by mouth.     clobetasol  ointment (TEMOVATE ) 0.05 % Apply topically at bedtime as needed. 45 g 2   colchicine 0.6 MG tablet Take by mouth.     Cyanocobalamin (HM VITAMIN B-12 ULTRA STRENGTH) 5000 MCG TBDP Take by mouth.     cyclobenzaprine (FLEXERIL) 10 MG tablet Take 10 mg by mouth at bedtime.     doxycycline (VIBRAMYCIN) 100 MG capsule Take 100 mg by mouth 2 (two) times daily.     EUTHYROX 137 MCG tablet Take 137 mcg by mouth daily.     HYDROcodone -acetaminophen  (NORCO/VICODIN) 5-325 MG tablet Take 1  tablet by mouth every 6 (six) hours as needed for severe pain (pain score 7-10). 10 tablet 0   hydroxychloroquine (PLAQUENIL) 200 MG tablet Take 1 tablet by mouth 2 (two) times daily.     ketorolac  (TORADOL ) 10 MG tablet Take 1 tablet (10 mg total) by mouth every 6 (six) hours as needed. 20 tablet 0   levothyroxine (SYNTHROID, LEVOTHROID) 125 MCG tablet Take 125 mcg by mouth daily before breakfast.     lisinopril (PRINIVIL,ZESTRIL) 10 MG tablet Take 10 mg by mouth daily.     methotrexate (RHEUMATREX) 2.5 MG tablet Take by mouth.     predniSONE  (DELTASONE ) 10 MG tablet 40 mg daily x 1 week, then 30 mg daily x 1 week, then 20 mg daily x 1 week, then 10 mg daily x 1 week, then 5 mg daily x 1 week. 74 tablet 0   No current facility-administered medications for this visit.    Patient confirms/reports the following allergies:  Allergies  Allergen Reactions   Penicillins Rash and Hives    No orders of the defined types were placed in this encounter.   AUTHORIZATION INFORMATION Primary Insurance: 1D#: Group #:  Secondary Insurance: 1D#: Group #:  SCHEDULE INFORMATION: Date: 05/31/24 Time: Location: [facility]

## 2024-03-07 NOTE — Telephone Encounter (Signed)
 Opened 2nd encounter in error.  [place] CMA

## 2024-03-07 NOTE — Telephone Encounter (Signed)
 Pt called to schedule colonoscopy from referral... pt was sent to your extension to leave a voicemail

## 2024-03-15 NOTE — Telephone Encounter (Signed)
 Pulmonary clearance received from Dr. Theotis on 03/08/23.  Patient has been cleared to have colonoscopy from Dr. Theotis.  Thanks,  [place], CMA

## 2024-03-21 NOTE — Telephone Encounter (Signed)
 Rheumatology clearance has been granted from Dr. Tobie [facility] Rheumatology).  Thanks,  [place], CMA

## 2024-03-24 NOTE — Telephone Encounter (Signed)
 Pulmonary Clearance received from Dr. Theotis (Duplicate) on 03/21/24.  Patient has been cleared to have colonoscopy.  Thanks,  [place], CMA

## 2024-05-24 ENCOUNTER — Telehealth: Payer: Self-pay

## 2024-05-24 NOTE — Telephone Encounter (Signed)
 Phone call returned to patient to reschedule her colonoscopy from 05/31/24 to 08/16/24.  Leigh in Endo notified.  Referral updated. Instructions updated.  Thanks,  [place], CMA

## 2024-05-24 NOTE — Telephone Encounter (Signed)
 The patient called to reschedule her colonoscopy.

## 2024-08-16 ENCOUNTER — Ambulatory Visit: Admit: 2024-08-16 | Admitting: Gastroenterology
